# Patient Record
Sex: Male | Born: 1995 | Race: White | Hispanic: No | Marital: Single | Smoking: Never smoker
Health system: Southern US, Community
[De-identification: ages and names within clinical notes are randomized; demographics above are authoritative.]

## PROBLEM LIST (undated history)

## (undated) DIAGNOSIS — F411 Generalized anxiety disorder: Secondary | ICD-10-CM

## (undated) DIAGNOSIS — K589 Irritable bowel syndrome without diarrhea: Secondary | ICD-10-CM

## (undated) HISTORY — DX: Irritable bowel syndrome, unspecified: K58.9

## (undated) HISTORY — DX: Generalized anxiety disorder: F41.1

---
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
## 2017-01-27 ENCOUNTER — Other Ambulatory Visit: Payer: Self-pay | Admitting: Primary Care

## 2017-01-27 DIAGNOSIS — K580 Irritable bowel syndrome with diarrhea: Secondary | ICD-10-CM

## 2017-02-18 ENCOUNTER — Telehealth: Payer: Self-pay | Admitting: *Deleted

## 2017-02-18 DIAGNOSIS — K580 Irritable bowel syndrome with diarrhea: Secondary | ICD-10-CM

## 2017-02-18 NOTE — Telephone Encounter (Signed)
Patient's dad called requesting a refill on Viberzi. Patient's dad stated that patient ran out of the prescription and his mom is a NP and was able to get samples of the Veberzi 100 mg and he has been taking that and it seems to be working better for him. Patient's dad requested a script for Viberzi 100 mg and needs it by tomorrow if possible because most pharmacist don't keep it in stock and will have to order it and it can take them a day to get it in.  Mrs. Cristal DeerChristopher requested a call back.

## 2017-02-19 MED ORDER — ELUXADOLINE 100 MG PO TABS: 100.0000 mg | ORAL_TABLET | Freq: Two times a day (BID) | ORAL | 0 refills | Status: DC

## 2017-02-19 NOTE — Telephone Encounter (Signed)
Please call in.  Thanks.   

## 2017-02-19 NOTE — Telephone Encounter (Signed)
Medication phoned to pharmacy.  

## 2017-03-15 ENCOUNTER — Other Ambulatory Visit: Payer: Self-pay | Admitting: Primary Care

## 2017-03-15 ENCOUNTER — Other Ambulatory Visit: Payer: Self-pay | Admitting: Family Medicine

## 2017-03-15 DIAGNOSIS — F411 Generalized anxiety disorder: Secondary | ICD-10-CM

## 2017-03-16 NOTE — Telephone Encounter (Signed)
Ok to refill? Electronically refill request for Eluxadoline (VIBERZI) 100 MG TABS.  Last prescribed on 02/19/17. Last seen on 11/04/2016

## 2017-03-17 NOTE — Telephone Encounter (Signed)
Called in medication to the pharmacy as instructed. 

## 2017-03-19 NOTE — Telephone Encounter (Signed)
Note left that pt had Viberzi for 24 more hrs,. [facility] [address] and he said the Denn[place] NancyViberzi is covered but when med was requested med was out of stock and it was too early to fill; last filled 02/20/17. Kathlene NovemberMike said med can be picked up on 03/20/17 around noon. I called contact # and spoke with pts mother (DPR signed) and gave this information. pts mom voiced understanding.

## 2017-04-22 ENCOUNTER — Other Ambulatory Visit: Payer: Self-pay

## 2017-04-22 ENCOUNTER — Other Ambulatory Visit: Payer: Self-pay | Admitting: Internal Medicine

## 2017-04-22 DIAGNOSIS — K580 Irritable bowel syndrome with diarrhea: Secondary | ICD-10-CM

## 2017-04-22 NOTE — Telephone Encounter (Signed)
Message left for patient to return my call.  

## 2017-04-22 NOTE — Telephone Encounter (Signed)
pts mom (DPR signed) left v/m requesting rx for Viberzi; request to pick up ASAP because pharmacy has to order med when receives rx. Last printed # 60 on 03/16/17. Pt last seen 11/04/16. Pts mom also wants to know when viberzi was changed from 75 mg to 100 mg. I advised Mrs Yetta BarreJones per 02/18/17 phone note that pts father had requested to change Viberzi to 100 mg. Mrs Yetta BarreJones voiced understanding.

## 2017-04-22 NOTE — Telephone Encounter (Signed)
Please notify patient and his parents that we require 2-3 days notice for refills, FYI for future refills. Also patient needs lab work to check liver and kidney function while on Viberzi. This has not been done in over 1 year. Please schedule a lab only appointment.  Also, do they need this printed or sent electronically?

## 2017-04-23 MED ORDER — ELUXADOLINE 100 MG PO TABS: 1.0000 | ORAL_TABLET | Freq: Two times a day (BID) | ORAL | 0 refills | Status: DC

## 2017-04-23 NOTE — Telephone Encounter (Signed)
Please call patient's father,Carvin,about rx.  He can be reached at [phone].

## 2017-04-23 NOTE — Telephone Encounter (Signed)
When grandfather came to pick up. I have notified grandfather the information below as will as I have already notified father.

## 2017-04-23 NOTE — Telephone Encounter (Signed)
Spoken and notified patient's father of William Singleton's comments. Patient's father stated that he will have patient call our office to schedule the lab appt.   He asked if we can call the Rx to the pharmacy. Patient has been our for a couple days  He also mention that they had the pharmacy order the medication just needed th authorization,

## 2017-04-23 NOTE — Telephone Encounter (Signed)
Mom called - she states that the rx can not be called in, that it has to be picked up, and that it must be done today. She also said that Mercy MooreGrandpa is coming to get the rx today, mom is out of town on work. She has limited access to her phone. Her phone number is [phone].

## 2017-04-23 NOTE — Telephone Encounter (Signed)
Noted. Rx printed for one month supply. Will provide additional refills once he's completed the required labs.

## 2017-04-29 ENCOUNTER — Other Ambulatory Visit (INDEPENDENT_AMBULATORY_CARE_PROVIDER_SITE_OTHER): Payer: 59

## 2017-04-29 DIAGNOSIS — K580 Irritable bowel syndrome with diarrhea: Secondary | ICD-10-CM

## 2017-04-30 LAB — COMPREHENSIVE METABOLIC PANEL
ALBUMIN: 4.3 g/dL (ref 3.5–5.2)
ALK PHOS: 76 U/L (ref 52–171)
ALT: 16 U/L (ref 0–53)
AST: 14 U/L (ref 0–37)
BILIRUBIN TOTAL: 0.4 mg/dL (ref 0.2–1.2)
BUN: 18 mg/dL (ref 6–23)
CALCIUM: 9.3 mg/dL (ref 8.4–10.5)
CO2: 28 mEq/L (ref 19–32)
CREATININE: 0.97 mg/dL (ref 0.40–1.50)
Chloride: 103 mEq/L (ref 96–112)
GFR: 105.72 mL/min (ref >60.00)
Glucose, Bld: 95 mg/dL (ref 70–99)
Potassium: 3.9 mEq/L (ref 3.5–5.1)
Sodium: 138 mEq/L (ref 135–145)
TOTAL PROTEIN: 6.8 g/dL (ref 6.0–8.3)

## 2017-05-01 ENCOUNTER — Encounter: Payer: Self-pay | Admitting: *Deleted

## 2017-05-01 NOTE — Telephone Encounter (Signed)
Patient's mother notified of results.  She would like rx called in to [facility] on [facility] [address].

## 2017-05-10 ENCOUNTER — Other Ambulatory Visit: Payer: Self-pay | Admitting: Primary Care

## 2017-05-10 DIAGNOSIS — K580 Irritable bowel syndrome with diarrhea: Secondary | ICD-10-CM

## 2017-05-11 NOTE — Telephone Encounter (Signed)
Ok to refill? Electronically refill request for Eluxadoline (VIBERZI) 100 MG TABS  Last prescribed on 04/23/2017.   According to patient's mother, this medication need to order a head of time and was told by pharmacy that it take over 1 week.  Patient's mother ask if we can call in the medication to [facility].

## 2017-05-11 NOTE — Telephone Encounter (Signed)
Called in medication to the pharmacy as instructed. 

## 2017-08-31 ENCOUNTER — Other Ambulatory Visit: Payer: Self-pay

## 2017-08-31 DIAGNOSIS — K580 Irritable bowel syndrome with diarrhea: Secondary | ICD-10-CM

## 2017-08-31 MED ORDER — ELUXADOLINE 100 MG PO TABS: 1.0000 | ORAL_TABLET | Freq: Two times a day (BID) | ORAL | 1 refills | Status: DC

## 2017-08-31 NOTE — Telephone Encounter (Signed)
Called in medication to the pharmacy as instructed. 

## 2017-08-31 NOTE — Telephone Encounter (Signed)
Please phone in Viberzi

## 2017-08-31 NOTE — Telephone Encounter (Signed)
PLEASE NOTE: All timestamps contained within this report are represented as [place] Standard Time. CONFIDENTIALTY NOTICE: This fax transmission is intended only for the addressee. It contains information that is legally privileged, confidential or otherwise protected from use or disclosure. If you are not the intended recipient, you are strictly prohibited from reviewing, disclosing, copying using or disseminating any of this information or taking any action in reliance on or regarding this information. If you have received this fax in error, please notify us immediately by telephone so that we can arrange for its return to us. Phone: [phone], Toll-Free: [phone], Fax: [phone] Page: 1 of 1 Call Id: [id number] [facility] Night - Client TELEPHONE ADVICE RECORD [facility] Patient Name: William Singleton Gender: Male DOB: 02/08/1996 Age: [age] Y 6 M 18 D Return Phone Number: [phone] (Primary) Address: City/State/Zip: [place] [employer] [place] [id number] Client [facility] Night - Client Client Site [facility] - Night Physician Vernona Riegerlark, Katherine - NP Contact Type Call Who Is Calling Patient / Member / Family / Caregiver Call Type Triage / Clinical Caller Name William Singleton Relationship To Patient Father Return Phone Number [phone] (Primary) Chief Complaint Prescription Refill or Medication Request (non symptomatic) Reason for Call Symptomatic / Request for Health Information Initial Comment Callers son is out of meds and needs pre authorization pharmacy has also faxed office and pt needs his meds. Translation No Nurse Assessment Guidelines Guideline Title Affirmed Question Affirmed Notes Nurse Date/Time (Eastern Time) Disp. Time Lamount Cohen(Eastern Time) Disposition Final User 08/28/2017 8:46:01 PM Clinical Call Yes Manson PasseyBrown, RN, Jetty Duhameleborah Caller Disagree/Comply Comply Caller Understands  Yes PreDisposition Home Care Comments User: Haydee Monicaeborah, Brown, RN Date/Time Lamount Cohen(Eastern Time): 08/28/2017 8:45:44 PM instructed pt dad that prior authorization would not occur over the weekend. Instructed him to call pharmacy and see what it would cost out of pocket to get 4 dose for the weekend, explained process of prior authorization, and that MD may have done their part, but that the insurance company may not have completed theirs. also instructed him to call the saturday clinic tomorrow am to see if they can do anything. dad verbalized understanding

## 2017-08-31 NOTE — Telephone Encounter (Signed)
Prior Berkley Harveyuth has been send to patient's insurance for VIBERZI 100 mg. Waiting on response.   VIBERZI 100 MG TABS Last prescribed on 05/11/2017. Last seen on 11/04/2016.

## 2017-08-31 NOTE — Telephone Encounter (Signed)
Patient's father was notified of the approval of the prior auth

## 2017-08-31 NOTE — Telephone Encounter (Signed)
Received responded from patient's insurance.  This medication is approved for coverage until 08/31/2018.

## 2017-11-27 ENCOUNTER — Other Ambulatory Visit: Payer: Self-pay | Admitting: Primary Care

## 2017-11-27 DIAGNOSIS — F411 Generalized anxiety disorder: Secondary | ICD-10-CM

## 2017-11-30 NOTE — Telephone Encounter (Signed)
Letter send to patient remind patient to schedule follow up for any more refills.

## 2017-12-30 ENCOUNTER — Ambulatory Visit: Payer: 59 | Admitting: Primary Care

## 2017-12-30 DIAGNOSIS — Z0289 Encounter for other administrative examinations: Secondary | ICD-10-CM

## 2018-01-08 ENCOUNTER — Encounter: Payer: Self-pay | Admitting: Primary Care

## 2018-01-08 ENCOUNTER — Ambulatory Visit (INDEPENDENT_AMBULATORY_CARE_PROVIDER_SITE_OTHER): Payer: 59 | Admitting: Primary Care

## 2018-01-08 VITALS — BP 120/82 | HR 71 | Temp 98.7°F | Ht 69.75 in | Wt 215.5 lb

## 2018-01-08 DIAGNOSIS — F411 Generalized anxiety disorder: Secondary | ICD-10-CM

## 2018-01-08 DIAGNOSIS — K580 Irritable bowel syndrome with diarrhea: Secondary | ICD-10-CM

## 2018-01-08 LAB — COMPREHENSIVE METABOLIC PANEL
ALBUMIN: 4.4 g/dL (ref 3.5–5.2)
ALK PHOS: 90 U/L (ref 52–171)
ALT: 23 U/L (ref 0–53)
AST: 16 U/L (ref 0–37)
BUN: 11 mg/dL (ref 6–23)
CO2: 30 meq/L (ref 19–32)
CREATININE: 0.92 mg/dL (ref 0.40–1.50)
Calcium: 9.4 mg/dL (ref 8.4–10.5)
Chloride: 103 mEq/L (ref 96–112)
GFR: 111.57 mL/min (ref >60.00)
Glucose, Bld: 86 mg/dL (ref 70–99)
Potassium: 4.2 mEq/L (ref 3.5–5.1)
Sodium: 140 mEq/L (ref 135–145)
Total Bilirubin: 0.6 mg/dL (ref 0.2–1.2)
Total Protein: 7.2 g/dL (ref 6.0–8.3)

## 2018-01-08 MED ORDER — ESCITALOPRAM OXALATE 20 MG PO TABS: 20.0000 mg | ORAL_TABLET | Freq: Every day | ORAL | 3 refills | Status: DC

## 2018-01-08 NOTE — Progress Notes (Signed)
Subjective:    Patient ID: William Singleton, male    DOB: 06-May-1996, 22 y.o.   MRN: [id number]  HPI  William Singleton is a 22 year old male who presents today for follow up and medication refill.  1) IBS: Currently managed on Viberzi 100 mg BID. He is compliant to his Viberzi and is taking twice daily.He denies diarrhea, constipation, abdominal pain, nausea, vomiting.   2) GAD: Currently managed on Lexapro 20 mg. He denies SI/HI. He's taken the semester off from school, plans to return in the Fall. He is needing refills of his Lexapro today.  Review of Systems  Constitutional: Negative for unexpected weight change.  Respiratory: Negative for shortness of breath.   Gastrointestinal: Negative for abdominal pain, blood in stool, constipation, diarrhea, rectal pain and vomiting.       Past Medical History:  Diagnosis Date  . Generalized anxiety disorder   . IBS (irritable bowel syndrome)      Social History   Socioeconomic History  . Marital status: Single    Spouse name: Not on file  . Number of children: Not on file  . Years of education: Not on file  . Highest education level: Not on file  Occupational History  . Not on file  Social Needs  . Financial resource strain: Not on file  . Food insecurity:    Worry: Not on file    Inability: Not on file  . Transportation needs:    Medical: Not on file    Non-medical: Not on file  Tobacco Use  . Smoking status: Never Smoker  . Smokeless tobacco: Never Used  Substance and Sexual Activity  . Alcohol use: No    Alcohol/week: 0.0 oz  . Drug use: No  . Sexual activity: Not on file  Lifestyle  . Physical activity:    Days per week: Not on file    Minutes per session: Not on file  . Stress: Not on file  Relationships  . Social connections:    Talks on phone: Not on file    Gets together: Not on file    Attends religious service: Not on file    Active member of club or organization: Not on file    Attends meetings of  clubs or organizations: Not on file    Relationship status: Not on file  . Intimate partner violence:    Fear of current or ex partner: Not on file    Emotionally abused: Not on file    Physically abused: Not on file    Forced sexual activity: Not on file  Other Topics Concern  . 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   Enjoys playing football, hanging out with friends.   Lives with father.    History reviewed. No pertinent surgical history.  No family history on file.  Allergies  Allergen Reactions  . Penicillins Rash    Current Outpatient Medications on File Prior to Visit  Medication Sig Dispense Refill  . Eluxadoline (VIBERZI) 100 MG TABS Take 1 tablet (100 mg total) by mouth 2 (two) times daily. 180 tablet 1   No current facility-administered medications on file prior to visit.     BP 120/82   Pulse 71   Temp 98.7 F (37.1 C) (Oral)   Ht 5' 9.75" (1.772 m)   Wt 215 lb 8 oz (97.8 kg)   SpO2 98%   BMI 31.14  kg/m    Objective:   Physical Exam  Constitutional: He appears well-nourished.  Neck: Neck supple.  Cardiovascular: Normal rate and regular rhythm.  Pulmonary/Chest: Effort normal and breath sounds normal. He has no wheezes. He has no rales.  Abdominal: Soft. Bowel sounds are normal. There is no tenderness.  Skin: Skin is warm and dry.  Psychiatric: He has a normal mood and affect.          Assessment & Plan:

## 2018-01-08 NOTE — Progress Notes (Signed)
Subjective:    Patient ID: HALO LASKI, male    DOB: [birth year], 22 y.o.   MRN: [id number]  HPI JC VERON is a 22 y.o. male who presents today for routine follow-up. Currently taking Viberzi and Lexapro. 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 Reports hanging with friends during free time. Denies and suicidal ideation or ETOH/Illicit drug/tabacco use. Is currently single and not sexually active.   Review of Systems  Constitutional: Negative for activity change, appetite change, fatigue and unexpected weight change.  Psychiatric/Behavioral: Negative for agitation, decreased concentration, dysphoric mood, self-injury and sleep disturbance. The patient is not nervous/anxious.    PHQ2 = 0    Past Medical History:  Diagnosis Date  . Generalized anxiety disorder   . IBS (irritable bowel syndrome)    History reviewed. No pertinent surgical history. Social History   Socioeconomic History  . Marital status: Single    Spouse name: Not on file  . Number of children: Not on file  . Years of education: Not on file  . Highest education level: Not on file  Occupational History  . Not on file  Social Needs  . Financial resource strain: Not on file  . Food insecurity:    Worry: Not on file    Inability: Not on file  . Transportation needs:    Medical: Not on file    Non-medical: Not on file  Tobacco Use  . Smoking status: Never Smoker  . Smokeless tobacco: Never Used  Substance and Sexual Activity  . Alcohol use: No    Alcohol/week: 0.0 oz  . Drug use: No  . Sexual activity: Not on file  Lifestyle  . Physical activity:    Days per week: Not on file    Minutes per session: Not on file  . Stress: Not on file  Relationships  . Social connections:    Talks on phone: Not on file    Gets together: Not on file    Attends religious service: Not on file    Active member of  club or organization: Not on file    Attends meetings of clubs or organizations: Not on file    Relationship status: Not on file  . Intimate partner violence:    Fear of current or ex partner: Not on file    Emotionally abused: Not on file    Physically abused: Not on file    Forced sexual activity: Not on file  Other Topics Concern  . 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   Enjoys playing football, hanging out with friends.   Lives with father.   No family history on file. Current Outpatient Medications on File Prior to Visit  Medication Sig Dispense Refill  . Eluxadoline (VIBERZI) 100 MG TABS Take 1 tablet (100 mg total) by mouth 2 (two) times daily. 180 tablet 1  . escitalopram (LEXAPRO) 20 MG tablet Take 1 tablet (20 mg total) by mouth daily. NEED APPOINTMENT FOR ANY MORE REFILLS 90 tablet 0   No current facility-administered medications on file prior to visit.     Objective:   Physical Exam  Constitutional: He appears well-nourished. No distress.  Abdominal: Soft. Bowel sounds are normal. He exhibits no distension and no mass. There is no tenderness.  Psychiatric: He has a normal mood and affect.  His speech is normal and behavior is normal. Thought content normal.    BP 120/82   Pulse 71   Temp 98.7 F (37.1 C) (Oral)   Ht 5' 9.75" (1.772 m)   Wt 215 lb 8 oz (97.8 kg)   SpO2 98%   BMI 31.14 kg/m       Assessment & Plan:   1. GAD (generalized anxiety disorder) - Doing well on Lexapro - Comprehensive metabolic panel - escitalopram (LEXAPRO) 20 MG tablet; Take 1 tablet (20 mg total) by mouth daily.  Dispense: 90 tablet; Refill: 3  2. Irritable bowel syndrome with diarrhea - Well managed on Viberzi - Comprehensive metabolic panel   Rebecka ApleyJoshua M Forrester Blando, RN, Adult-Geriatric Nurse Practitioner Student

## 2018-01-08 NOTE — Patient Instructions (Signed)
Glad everything is going well. We have sent your prescription to refill your lexapro.   Best of luck at school and when you need a refill on your Viberzi, just have your pharmacy send us a request.  Stop by the lab on your way out.   It has been a pleasure seeing you today. Rebecka ApleyJoshua M Melenie Minniear, RN, Adult-Geriatric Nurse Practitioner Student and Mayra ReelKate Clark, AGNP

## 2018-01-08 NOTE — Assessment & Plan Note (Signed)
Doing well on Lexapro 20 mg, continue same. Refills sent to pharmacy.

## 2018-01-08 NOTE — Assessment & Plan Note (Signed)
Stable on Viberzi, continue same. Discussed to have his pharmacy contact us when needing refills. CMP pending today.

## 2018-03-29 ENCOUNTER — Other Ambulatory Visit: Payer: Self-pay | Admitting: Internal Medicine

## 2018-03-29 DIAGNOSIS — K580 Irritable bowel syndrome with diarrhea: Secondary | ICD-10-CM

## 2018-03-30 NOTE — Telephone Encounter (Unsigned)
Copied from CRM [phone]#[id number]. Topic: Quick Communication - Rx Refill/Question >> Mar 30, 2018 12:07 PM Floria RavelingStovall, Shana A wrote: Medication:  VIBERZI 100 MG TABS [Pharmacy Med Name: VIBERZI 100MG   Has the patient contacted their pharmacy? No  (Agent: If no, request that the patient contact the pharmacy for the refill.) (Agent: If yes, when and what did the pharmacy advise?)  Preferred Pharmacy (with phone number or street name): [facility] [address]   Agent: Please be advised that RX refills may take up to 3 business days. We ask that you follow-up with your pharmacy.

## 2018-03-30 NOTE — Telephone Encounter (Signed)
Viberzi refill Last OV:01/08/18 Last refill:08/31/17 180 tab/1 refill ZOX:WRUEAPCP:[phone] [place] - [address] [phone] (Phone) [phone] (Fax)

## 2018-03-31 ENCOUNTER — Encounter: Payer: Self-pay | Admitting: Primary Care

## 2018-03-31 ENCOUNTER — Other Ambulatory Visit: Payer: Self-pay | Admitting: Internal Medicine

## 2018-03-31 DIAGNOSIS — K580 Irritable bowel syndrome with diarrhea: Secondary | ICD-10-CM

## 2018-03-31 NOTE — Telephone Encounter (Signed)
Noted, refill sent to pharmacy. 

## 2018-04-08 ENCOUNTER — Encounter: Payer: Self-pay | Admitting: Primary Care

## 2018-04-09 ENCOUNTER — Encounter: Payer: Self-pay | Admitting: Primary Care

## 2018-05-27 ENCOUNTER — Other Ambulatory Visit: Payer: Self-pay | Admitting: Primary Care

## 2018-05-27 DIAGNOSIS — K580 Irritable bowel syndrome with diarrhea: Secondary | ICD-10-CM

## 2018-05-27 DIAGNOSIS — F411 Generalized anxiety disorder: Secondary | ICD-10-CM

## 2018-05-27 MED ORDER — ESCITALOPRAM OXALATE 20 MG PO TABS
20.0000 mg | ORAL_TABLET | Freq: Every day | ORAL | 3 refills | Status: DC
Start: 2018-05-27 — End: 2018-07-26

## 2018-05-27 NOTE — Telephone Encounter (Signed)
Patient has enough medication on file at his pharmacy until January 2020. Will you please find out why they are requesting a refill this soon?

## 2018-05-27 NOTE — Telephone Encounter (Signed)
Refill of Viberzi  LOV 01/08/18 K. Clark  [facility] 03/[facility]  #180  1 refill   [id number] - [place] - [address] AT [facility] [address] [place] [address]         [phone] (Phone) [phone] (Fax)

## 2018-05-27 NOTE — Telephone Encounter (Signed)
Copied from CRM [phone]. Topic: Quick Communication - Rx Refill/Question >> May 27, 2018 12:33 PM William Singleton, [facility] wrote: Medication:  VIBERZI 100 MG TABS escitalopram (LEXAPRO) 20 MG tablet  Has the patient contacted their pharmacy? No - new pharmacy, pt is in school (Agent: If no, request that the patient contact the pharmacy for the refill.) (Agent: If yes, when and what did the pharmacy advise?)  Preferred Pharm[facility] (with phone number or street name): [id number] - [place], [place] - [address] [facility] [address] [place] [address] [phone] (Phone) [phone] (Fax)  Agent: Please be advised that RX refills may take up to 3 business days. We ask that you follow-up with your pharmacy.

## 2018-07-25 ENCOUNTER — Other Ambulatory Visit: Payer: Self-pay | Admitting: Primary Care

## 2018-07-25 DIAGNOSIS — F411 Generalized anxiety disorder: Secondary | ICD-10-CM

## 2018-09-22 ENCOUNTER — Other Ambulatory Visit: Payer: Self-pay | Admitting: Primary Care

## 2018-09-22 DIAGNOSIS — K580 Irritable bowel syndrome with diarrhea: Secondary | ICD-10-CM

## 2018-09-22 NOTE — Telephone Encounter (Signed)
Patient send a MyChart message about this refill. Last prescribed on 03/31/2018. Last seen on 01/08/2018

## 2018-09-23 NOTE — Telephone Encounter (Signed)
Spoke to pt. He understands that another refill not be given until he has an OV.

## 2018-09-23 NOTE — Telephone Encounter (Signed)
Patient never responded to my message back.  I went ahead and sent refills, please remind him that he will need to be seen in April or during his break from college for further refills.

## 2018-09-27 ENCOUNTER — Telehealth: Payer: Self-pay | Admitting: *Deleted

## 2018-09-27 NOTE — Telephone Encounter (Signed)
Spoke to pts father,Amaan, who states viberzi is requiring a PA and wanted to advise Johny DrillingChan to be on the lookout for the pharmacy notification.

## 2018-09-29 NOTE — Telephone Encounter (Signed)
Pt's father wants to know about the blood work the pt has to do once a year in regards to the Viberzi. Pt's father is requesting a call back [phone].

## 2018-09-29 NOTE — Telephone Encounter (Signed)
Spoke to pt's Dad because I was having trouble finding him on CoverMyMeds. He said they took care of it. He has a rebate card and has gotten his medication.

## 2018-09-29 NOTE — Telephone Encounter (Signed)
Please notify the patient and his father that he needs to have liver enzymes and electrolytes checked once a year given potential side effects of the medication.  He will be due in April and will need this for further refills after April.  Him face-to-face in the office at that time.  If he will be in school then he is fine to wait until May when he is home for the summer.

## 2018-09-30 NOTE — Telephone Encounter (Signed)
Left detailed message on VM for Dad. °

## 2018-10-15 ENCOUNTER — Ambulatory Visit (INDEPENDENT_AMBULATORY_CARE_PROVIDER_SITE_OTHER): Payer: 59 | Admitting: Primary Care

## 2018-10-15 ENCOUNTER — Encounter: Payer: Self-pay | Admitting: Primary Care

## 2018-10-15 VITALS — BP 126/82 | HR 72 | Temp 98.3°F | Ht 69.75 in | Wt 211.8 lb

## 2018-10-15 DIAGNOSIS — K580 Irritable bowel syndrome with diarrhea: Secondary | ICD-10-CM | POA: Diagnosis not present

## 2018-10-15 DIAGNOSIS — F411 Generalized anxiety disorder: Secondary | ICD-10-CM | POA: Diagnosis not present

## 2018-10-15 LAB — COMPREHENSIVE METABOLIC PANEL
ALT: 22 U/L (ref 0–53)
AST: 35 U/L (ref 0–37)
Albumin: 4.5 g/dL (ref 3.5–5.2)
Alkaline Phosphatase: 75 U/L (ref 39–117)
BUN: 15 mg/dL (ref 6–23)
CO2: 30 mEq/L (ref 19–32)
Calcium: 9.5 mg/dL (ref 8.4–10.5)
Chloride: 101 mEq/L (ref 96–112)
Creatinine, Ser: 1.05 mg/dL (ref 0.40–1.50)
GFR: 89.44 mL/min (ref >60.00)
Glucose, Bld: 86 mg/dL (ref 70–99)
Potassium: 4.2 mEq/L (ref 3.5–5.1)
Sodium: 138 mEq/L (ref 135–145)
Total Bilirubin: 0.8 mg/dL (ref 0.2–1.2)
Total Protein: 7 g/dL (ref 6.0–8.3)

## 2018-10-15 NOTE — Patient Instructions (Signed)
Stop by the lab prior to leaving today. I will notify you of your results once received.   Continue Viberzi 100 mg twice daily as prescribed.  Continue escitalopram 20 mg (Lexapro) daily as prescribed.  Notify your pharmacy when you need refills.  It was a pleasure to see you today!

## 2018-10-15 NOTE — Assessment & Plan Note (Signed)
Doing well on Lexapro 20 mg, continue same. Denies SI/HI. Will refill when warranted.

## 2018-10-15 NOTE — Assessment & Plan Note (Signed)
Doing well on Viberzi 100 mg BID, continue same. CMP pending. Abdominal exam unremarkable. Will send refills when warranted.

## 2018-10-15 NOTE — Progress Notes (Signed)
Subjective:    Patient ID: William Singleton, male    DOB: [birth year], 22 y.o.   MRN: [id number]  HPI  William Singleton is a 23 year old male who presents today for follow up.  1) IBS: Currently managed on Viberzi 100 mg twice daily for IBS. He's been managed on this for 2-3 years for IBS symptoms and has great success. He denies abdominal pain, diarrhea, bloating.  2) GAD: Currently managed on escitalopram 20 mg daily. Overall feels well managed. He denies SI/HI.   Review of Systems  Respiratory: Negative for shortness of breath.   Cardiovascular: Negative for chest pain.  Gastrointestinal: Negative for abdominal distention, abdominal pain, diarrhea and nausea.  Psychiatric/Behavioral: Negative for suicidal ideas.       Feels well managed on Lexapro 20 mg        Past Medical History:  Diagnosis Date  . Generalized anxiety disorder   . IBS (irritable bowel syndrome)      Social History   Socioeconomic History  . Marital status: Single    Spouse name: Not on file  . Number of children: Not on file  . Years of education: Not on file  . Highest education level: Not on file  Occupational History  . Not on file  Social Needs  . Financial resource strain: Not on file  . Food insecurity:    Worry: Not on file    Inability: Not on file  . Transportation needs:    Medical: Not on file    Non-medical: Not on file  Tobacco Use  . Smoking status: Never Smoker  . Smokeless tobacco: Never Used  Substance and Sexual Activity  . Alcohol use: No    Alcohol/week: 0.0 standard drinks  . Drug use: No  . Sexual activity: Not on file  Lifestyle  . Physical activity:    Days per week: Not on file    Minutes per session: Not on file  . Stress: Not on file  Relationships  . Social connections:    Talks on phone: Not on file    Gets together: Not on file    Attends religious service: Not on file    Active member of club or organization: Not on file    Attends meetings of  clubs or organizations: Not on file    Relationship status: Not on file  . Intimate partner violence:    Fear of current or ex partner: Not on file    Emotionally abused: Not on file    Physically abused: Not on file    Forced sexual activity: Not on file  Other Topics Concern  . 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   Enjoys playing football, hanging out with friends.   Lives with father.    No past surgical history on file.  No family history on file.  Allergies  Allergen Reactions  . Penicillins Rash    Current Outpatient Medications on File Prior to Visit  Medication Sig Dispense Refill  . escitalopram (LEXAPRO) 20 MG tablet TAKE 1 TABLET BY MOUTH EVERY DAY 90 tablet 1  . VIBERZI 100 MG TABS TAKE 1 TABLET BY MOUTH TWICE DAILY 180 tablet 0   No current facility-administered medications on file prior to visit.     BP 126/82   Pulse 72   Temp 98.3 F (36.8 C) (Oral)   Ht 5' 9.75" (1.772 m)   Wt  211 lb 12 oz (96 kg)   SpO2 98%   BMI 30.60 kg/m    Objective:   Physical Exam  Constitutional: He appears well-nourished.  Neck: Neck supple.  Cardiovascular: Normal rate and regular rhythm.  Respiratory: Effort normal and breath sounds normal.  GI: Soft. There is no abdominal tenderness.  Skin: Skin is warm and dry.  Psychiatric: He has a normal mood and affect.           Assessment & Plan:

## 2019-01-04 ENCOUNTER — Other Ambulatory Visit: Payer: Self-pay | Admitting: Primary Care

## 2019-01-04 DIAGNOSIS — K580 Irritable bowel syndrome with diarrhea: Secondary | ICD-10-CM

## 2019-01-05 NOTE — Telephone Encounter (Signed)
Last prescribed on 09/23/2018. Last office visit on 10/15/2018 . No future appointment

## 2019-01-05 NOTE — Telephone Encounter (Signed)
Noted, refill sent to pharmacy. 

## 2019-02-08 ENCOUNTER — Other Ambulatory Visit: Payer: Self-pay | Admitting: Primary Care

## 2019-02-08 DIAGNOSIS — F411 Generalized anxiety disorder: Secondary | ICD-10-CM

## 2019-02-08 NOTE — Telephone Encounter (Signed)
Last prescribed on 07/26/2018. Last office visit on 10/15/2018. No future appointment

## 2019-02-08 NOTE — Telephone Encounter (Signed)
Noted, refills sent to pharmacy. 

## 2019-07-08 ENCOUNTER — Other Ambulatory Visit: Payer: Self-pay | Admitting: Primary Care

## 2019-07-08 DIAGNOSIS — K580 Irritable bowel syndrome with diarrhea: Secondary | ICD-10-CM

## 2019-07-08 NOTE — Telephone Encounter (Signed)
Could you refill this for William Singleton.  Last prescribed on 01/05/2019 . Last appointment on 10/15/2018. No future appointment

## 2019-07-08 NOTE — Telephone Encounter (Signed)
Patient called and said he ran out of his medication yesterday. Patient said the name of the medication is Viberzi  Patient uses [facility]

## 2019-11-28 ENCOUNTER — Telehealth: Payer: Self-pay | Admitting: Internal Medicine

## 2019-11-28 DIAGNOSIS — K580 Irritable bowel syndrome with diarrhea: Secondary | ICD-10-CM

## 2019-11-30 NOTE — Telephone Encounter (Signed)
Please notify patient that we need to see him in the office for follow-up and repeat labs in order to continue to refill his IBS medication.  Please notify me when the appointment has been scheduled, I can send a 30-day supply until he is seen.

## 2019-11-30 NOTE — Telephone Encounter (Signed)
Last prescribed on 07/08/2019. Last appointment on 10/15/2018. No future appointment

## 2019-12-01 NOTE — Telephone Encounter (Signed)
Noted, refill sent to pharmacy. 

## 2019-12-01 NOTE — Telephone Encounter (Signed)
Spoken and notified patient of William Singleton comments. Follow up has been schedule on 12/26/2019.

## 2019-12-06 NOTE — Telephone Encounter (Signed)
Patient is calling in regards to refill.  He stated that he called yesterday morning and a message was going to be sent over but I do not see anything Patient stated that his insurance is requesting an approval from the doctor before they will cover the medication  Please advise

## 2019-12-08 NOTE — Telephone Encounter (Signed)
Message left for patient to return my call.  Need update insurance information for patient to do the prior auth.

## 2019-12-13 NOTE — Telephone Encounter (Signed)
Message left for patient to return my call.  

## 2019-12-15 ENCOUNTER — Telehealth: Payer: Self-pay

## 2019-12-15 NOTE — Telephone Encounter (Signed)
[employer] is having trouble doing electronic PA. Asked that you call them at [phone] to do PA on Viberzi.

## 2019-12-19 NOTE — Telephone Encounter (Signed)
Noted. However need updated insurance card. Left 3 messages for patient to return my call for the updated information.

## 2019-12-26 ENCOUNTER — Ambulatory Visit: Payer: 59 | Admitting: Primary Care

## 2019-12-31 ENCOUNTER — Other Ambulatory Visit: Payer: Self-pay | Admitting: Primary Care

## 2019-12-31 DIAGNOSIS — F411 Generalized anxiety disorder: Secondary | ICD-10-CM

## 2020-01-04 ENCOUNTER — Ambulatory Visit (INDEPENDENT_AMBULATORY_CARE_PROVIDER_SITE_OTHER): Payer: 59 | Admitting: Primary Care

## 2020-01-04 ENCOUNTER — Encounter: Payer: Self-pay | Admitting: Primary Care

## 2020-01-04 ENCOUNTER — Other Ambulatory Visit: Payer: Self-pay

## 2020-01-04 DIAGNOSIS — K580 Irritable bowel syndrome with diarrhea: Secondary | ICD-10-CM

## 2020-01-04 DIAGNOSIS — F411 Generalized anxiety disorder: Secondary | ICD-10-CM | POA: Diagnosis not present

## 2020-01-04 MED ORDER — VIBERZI 100 MG PO TABS: 1.0000 | ORAL_TABLET | Freq: Two times a day (BID) | ORAL | 3 refills | Status: DC

## 2020-01-04 MED ORDER — ESCITALOPRAM OXALATE 20 MG PO TABS: ORAL_TABLET | ORAL | 3 refills | Status: DC

## 2020-01-04 NOTE — Patient Instructions (Signed)
Stop by the lab prior to leaving today. I will notify you of your results once received.   It was a pleasure to see you today!  

## 2020-01-04 NOTE — Assessment & Plan Note (Signed)
Doing well on Viberzi, CMP pending. Refills sent to pharmacy.

## 2020-01-04 NOTE — Progress Notes (Signed)
Subjective:    Patient ID: William Singleton, male    DOB: May 21, 1996, 24 y.o.   MRN: [id number]  HPI  This visit occurred during the SARS-CoV-2 public health emergency.  Safety protocols were in place, including screening questions prior to the visit, additional usage of staff PPE, and extensive cleaning of exam room while observing appropriate contact time as indicated for disinfecting solutions.   [name] is a 24 year old male with a history of IBS, GAD who presents today for follow up and annual lab testing.  Currently managed on escitalopram 20 mg daily for anxiety. Overall doing well on current regimen. Denies SI/HI.   Currently managed on Viberzi 100 mg twice daily for IBS. Sometimes will take 100 mg once daily. He denies constipation, bloody stools.   BP Readings from Last 3 Encounters:  01/04/20 128/82  10/15/18 126/82  01/08/18 120/82     Review of Systems  Respiratory: Negative for shortness of breath.   Cardiovascular: Negative for chest pain.  Gastrointestinal: Negative for blood in stool and constipation.  Psychiatric/Behavioral: The patient is not nervous/anxious.        Past Medical History:  Diagnosis Date  . Generalized anxiety disorder   . IBS (irritable bowel syndrome)      Social History   Socioeconomic History  . Marital status: Single    Spouse name: Not on file  . Number of children: Not on file  . Years of education: Not on file  . Highest education level: Not on file  Occupational History  . Not on file  Tobacco Use  . Smoking status: Never Smoker  . Smokeless tobacco: Never Used  Substance and Sexual Activity  . Alcohol use: No    Alcohol/week: 0.0 standard drinks  . Drug use: No  . Sexual activity: Not on file  Other Topics Concern  . Not on file  Social History Narrative   Psychologist, clinical at [employer].   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   Enjoys playing football, hanging out with friends.   Lives with father.    Social Determinants of Health   Financial Resource Strain:   . Difficulty of Paying Living Expenses:   Food Insecurity:   . Worried About Charity fundraiser in the Last Year:   . Arboriculturist in the Last Year:   Transportation Needs:   . Film/video editor (Medical):   Marland Kitchen Lack of Transportation (Non-Medical):   Physical Activity:   . Days of Exercise per Week:   . Minutes of Exercise per Session:   Stress:   . Feeling of Stress :   Social Connections:   . Frequency of Communication with Friends and Family:   . Frequency of Social Gatherings with Friends and Family:   . Attends Religious Services:   . Active Member of Clubs or Organizations:   . Attends Archivist Meetings:   Marland Kitchen Marital Status:   Intimate Partner Violence:   . Fear of Current or Ex-Partner:   . Emotionally Abused:   Marland Kitchen Physically Abused:   . Sexually Abused:     No past surgical history on file.  No family history on file.  Allergies  Allergen Reactions  . Penicillins Rash    Current Outpatient Medications on File Prior to Visit  Medication Sig Dispense Refill  . escitalopram (LEXAPRO) 20 MG tablet TAKE 1 TABLET(20 MG) BY MOUTH DAILY FOR ANXIETY 30 tablet 0  . VIBERZI 100 MG TABS TAKE 1  TABLET BY MOUTH TWICE DAILY 60 tablet 0   No current facility-administered medications on file prior to visit.    BP 128/82   Pulse 69   Temp (!) 97 F (36.1 C) (Temporal)   Ht 5' 9.75" (1.772 m)   Wt 207 lb 12 oz (94.2 kg)   SpO2 98%   BMI 30.02 kg/m    Objective:   Physical Exam  Constitutional: He appears well-nourished.  Cardiovascular: Normal rate and regular rhythm.  Respiratory: Effort normal and breath sounds normal.  Musculoskeletal:     Cervical back: Neck supple.  Skin: Skin is warm and dry.  Psychiatric: He has a normal mood and affect.           Assessment & Plan:

## 2020-01-04 NOTE — Assessment & Plan Note (Signed)
Doing well on Lexapro 20 mg. Denies SI/HI. Refills sent to pharmacy.

## 2020-01-05 LAB — COMPREHENSIVE METABOLIC PANEL
ALT: 17 U/L (ref 0–53)
AST: 15 U/L (ref 0–37)
Albumin: 4.3 g/dL (ref 3.5–5.2)
Alkaline Phosphatase: 63 U/L (ref 39–117)
BUN: 14 mg/dL (ref 6–23)
CO2: 32 mEq/L (ref 19–32)
Calcium: 8.9 mg/dL (ref 8.4–10.5)
Chloride: 101 mEq/L (ref 96–112)
Creatinine, Ser: 1.08 mg/dL (ref 0.40–1.50)
GFR: 85.57 mL/min (ref >60.00)
Glucose, Bld: 86 mg/dL (ref 70–99)
Potassium: 4.1 mEq/L (ref 3.5–5.1)
Sodium: 139 mEq/L (ref 135–145)
Total Bilirubin: 0.6 mg/dL (ref 0.2–1.2)
Total Protein: 6.5 g/dL (ref 6.0–8.3)

## 2020-08-10 ENCOUNTER — Telehealth: Payer: Self-pay | Admitting: Primary Care

## 2020-08-10 DIAGNOSIS — K580 Irritable bowel syndrome with diarrhea: Secondary | ICD-10-CM

## 2020-08-17 NOTE — Telephone Encounter (Signed)
Joellen, did you refill his Viberzi 100 mg? If so then it says "print" and not "normal". What's going on? We can refill through April 2022.

## 2020-08-17 NOTE — Telephone Encounter (Signed)
Pharmacy never received prescription and pt is out. Please resend

## 2020-08-17 NOTE — Telephone Encounter (Signed)
Please call dad  [phone]

## 2020-08-20 ENCOUNTER — Other Ambulatory Visit: Payer: Self-pay

## 2020-08-20 DIAGNOSIS — K580 Irritable bowel syndrome with diarrhea: Secondary | ICD-10-CM

## 2020-08-20 MED ORDER — VIBERZI 100 MG PO TABS: ORAL_TABLET | ORAL | 1 refills | Status: DC

## 2020-08-20 NOTE — Telephone Encounter (Signed)
Patient's father came into office checking on status. Please advise and call when sent in electronically. Call back is [phone]

## 2020-08-20 NOTE — Telephone Encounter (Signed)
Script printed but I have faxed to the number for local pharmacy

## 2020-08-20 NOTE — Telephone Encounter (Signed)
Sent in and called and let patient dad know.

## 2020-08-22 ENCOUNTER — Other Ambulatory Visit: Payer: Self-pay

## 2020-08-22 DIAGNOSIS — K580 Irritable bowel syndrome with diarrhea: Secondary | ICD-10-CM

## 2020-08-22 MED ORDER — VIBERZI 100 MG PO TABS: ORAL_TABLET | ORAL | 1 refills | Status: DC

## 2021-03-09 ENCOUNTER — Other Ambulatory Visit: Payer: Self-pay | Admitting: Primary Care

## 2021-03-09 DIAGNOSIS — K580 Irritable bowel syndrome with diarrhea: Secondary | ICD-10-CM

## 2021-03-13 ENCOUNTER — Telehealth: Payer: Self-pay | Admitting: Primary Care

## 2021-03-13 NOTE — Telephone Encounter (Signed)
Called dad (has DPR on file) let know we have called in refill and he needs to be seen for more. He did not want to make appointment at this time states he will have patient call to make. No further action needed at this time.

## 2021-03-13 NOTE — Telephone Encounter (Signed)
Patient Father  Vonna Kotyk came in office requesting to know if  patient medications was refilled . Advise Father that patient will have to call for that info. Give father DPR  to have patient fill out

## 2021-03-20 ENCOUNTER — Telehealth: Payer: Self-pay | Admitting: Primary Care

## 2021-03-20 ENCOUNTER — Telehealth: Payer: Self-pay

## 2021-03-20 ENCOUNTER — Ambulatory Visit: Payer: 59 | Admitting: Primary Care

## 2021-03-20 NOTE — Telephone Encounter (Signed)
[facility] Night - Client Nonclinical Telephone Record AccessNurse Client [facility] Night - Client Client Site [facility] [place] - Night Contact Type Call Who Is Calling Patient / Member / Family / Caregiver Caller Name Kaisei Gilbo Phone Number [phone] Patient Name William Singleton Patient DOB [birth year] Call Type Message Only Information Provided Reason for Call Request to Reschedule Office Appointment Initial Comment caller states his son has an appt today at 1130 and he's unable to get off work / caller asking can it be reschedule for next Wednesday Patient request to speak to RN No Disp. Time Disposition Final User 03/20/2021 7:56:10 AM General Information Provided Yes Tiburcio Pea, Lanette Call Closed By: Evette Doffing Transaction Date/Time: 03/20/2021 7:53:01 AM (ET)

## 2021-03-20 NOTE — Telephone Encounter (Signed)
Called pt to reschedule appt. Pt did not answer so I left voicemail to return call. Held appt slot for the same time as previous time

## 2021-03-27 ENCOUNTER — Ambulatory Visit (INDEPENDENT_AMBULATORY_CARE_PROVIDER_SITE_OTHER): Payer: 59 | Admitting: Primary Care

## 2021-03-27 ENCOUNTER — Other Ambulatory Visit: Payer: Self-pay

## 2021-03-27 ENCOUNTER — Encounter: Payer: Self-pay | Admitting: Primary Care

## 2021-03-27 VITALS — BP 118/72 | HR 78 | Temp 98.0°F | Ht 69.75 in | Wt 203.0 lb

## 2021-03-27 DIAGNOSIS — F411 Generalized anxiety disorder: Secondary | ICD-10-CM

## 2021-03-27 DIAGNOSIS — K580 Irritable bowel syndrome with diarrhea: Secondary | ICD-10-CM | POA: Diagnosis not present

## 2021-03-27 LAB — COMPREHENSIVE METABOLIC PANEL
ALT: 17 U/L (ref 0–53)
AST: 14 U/L (ref 0–37)
Albumin: 4.3 g/dL (ref 3.5–5.2)
Alkaline Phosphatase: 72 U/L (ref 39–117)
BUN: 11 mg/dL (ref 6–23)
CO2: 31 mEq/L (ref 19–32)
Calcium: 9.4 mg/dL (ref 8.4–10.5)
Chloride: 101 mEq/L (ref 96–112)
Creatinine, Ser: 0.93 mg/dL (ref 0.40–1.50)
GFR: 116.05 mL/min (ref >60.00)
Glucose, Bld: 74 mg/dL (ref 70–99)
Potassium: 4.3 mEq/L (ref 3.5–5.1)
Sodium: 137 mEq/L (ref 135–145)
Total Bilirubin: 0.4 mg/dL (ref 0.2–1.2)
Total Protein: 7 g/dL (ref 6.0–8.3)

## 2021-03-27 LAB — CBC
HCT: 42.7 % (ref 39.0–52.0)
Hemoglobin: 14.9 g/dL (ref 13.0–17.0)
MCHC: 34.8 g/dL (ref 30.0–36.0)
MCV: 87.9 fl (ref 78.0–100.0)
Platelets: 343 10*3/uL (ref 150.0–400.0)
RBC: 4.86 Mil/uL (ref 4.22–5.81)
RDW: 13 % (ref 11.5–15.5)
WBC: 8 10*3/uL (ref 4.0–10.5)

## 2021-03-27 MED ORDER — VIBERZI 75 MG PO TABS: 75.0000 mg | ORAL_TABLET | Freq: Two times a day (BID) | ORAL | 0 refills | Status: DC

## 2021-03-27 NOTE — Patient Instructions (Signed)
We reduced the dose of your Viberzi to 75 mg. Take this once daily, okay to take another dose if needed.  Stop by the lab prior to leaving today. I will notify you of your results once received.   Update me in 3-4 months.  It was a pleasure to see you today!

## 2021-03-27 NOTE — Assessment & Plan Note (Signed)
Discussed the option for weaning off Viberzi, he is willing to wean down to 75 mg once daily and take second dose if needed. Agreed.   He will update in 3-4 months, may consider discontinuation to PRN use.   CMP and CBC pending.

## 2021-03-27 NOTE — Assessment & Plan Note (Signed)
Stopped Lexapro 20 mg by himself a few months ago, is doing well off treatment. Continue fof treatment.

## 2021-03-27 NOTE — Progress Notes (Signed)
Subjective:    Patient ID: William Singleton, male    DOB: 1996-07-10, 25 y.o.   MRN: [id number]  HPI  William Singleton is a very pleasant 25 y.o. male with a history of IBS and GAD who presents today for medication refill and follow up.  Long history of IBS, has been managed on Viberzi 100 mg BID since 2017. He's been without his Viberzi for about two weeks as he ran out. Overall he's done well without his medication, but is on vacation from work.    He's mostly been taking one tablet once daily, will take a second tablet if he eats something that may disrupt his bowels. Doing well on once daily tablet.   Managed on Lexapro 20 mg for GAD, has been taking for years but stopped taking 2-3 months ago, has done well overall off of treatment. He doesn't feel anxious or nervous. His anxiety mostly stemmed from his IBS symptoms.      Review of Systems  Respiratory:  Negative for shortness of breath.   Cardiovascular:  Negative for chest pain.  Gastrointestinal:  Negative for abdominal pain and diarrhea.  Psychiatric/Behavioral:  The patient is not nervous/anxious.         Past Medical History:  Diagnosis Date   Generalized anxiety disorder    IBS (irritable bowel syndrome)     Social History   Socioeconomic History   Marital status: Single    Spouse name: Not on file   Number of children: Not on file   Years of education: Not on file   Highest education level: Not on file  Occupational History   Not on file  Tobacco Use   Smoking status: Never   Smokeless tobacco: Never  Substance and Sexual Activity   Alcohol use: No    Alcohol/week: 0.0 standard drinks   Drug use: No   Sexual activity: Not on file  Other Topics Concern   Not on file  Social History Narrative   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   Enjoys playing football, hanging out with friends.   Lives with father.   Social Determinants of Health   Financial Resource  Strain: Not on file  Food Insecurity: Not on file  Transportation Needs: Not on file  Physical Activity: Not on file  Stress: Not on file  Social Connections: Not on file  Intimate Partner Violence: Not on file    History reviewed. No pertinent surgical history.  History reviewed. No pertinent family history.  Allergies  Allergen Reactions   Penicillins Rash    Current Outpatient Medications on File Prior to Visit  Medication Sig Dispense Refill   Eluxadoline (VIBERZI) 100 MG TABS TAKE 1 TABLET BY MOUTH TWICE DAILY. Must be seen for further refills. 60 tablet 0   escitalopram (LEXAPRO) 20 MG tablet TAKE 1 TABLET(20 MG) BY MOUTH DAILY FOR ANXIETY 90 tablet 3   No current facility-administered medications on file prior to visit.    BP 118/72   Pulse 78   Temp 98 F (36.7 C) (Temporal)   Ht 5' 9.75" (1.772 m)   Wt 203 lb (92.1 kg)   SpO2 99%   BMI 29.34 kg/m  Objective:   Physical Exam Cardiovascular:     Rate and Rhythm: Normal rate and regular rhythm.  Pulmonary:     Effort: Pulmonary effort is normal.     Breath sounds: Normal breath sounds. No wheezing or rales.  Abdominal:  General: Abdomen is flat.     Palpations: Abdomen is soft.     Tenderness: There is no abdominal tenderness.  Musculoskeletal:     Cervical back: Neck supple.  Skin:    General: Skin is warm and dry.  Neurological:     Mental Status: He is alert and oriented to person, place, and time.          Assessment & Plan:      This visit occurred during the SARS-CoV-2 public health emergency.  Safety protocols were in place, including screening questions prior to the visit, additional usage of staff PPE, and extensive cleaning of exam room while observing appropriate contact time as indicated for disinfecting solutions.

## 2021-09-24 ENCOUNTER — Telehealth (INDEPENDENT_AMBULATORY_CARE_PROVIDER_SITE_OTHER): Payer: 59 | Admitting: Primary Care

## 2021-09-24 ENCOUNTER — Encounter: Payer: Self-pay | Admitting: Primary Care

## 2021-09-24 VITALS — Ht 69.75 in | Wt 203.0 lb

## 2021-09-24 DIAGNOSIS — F411 Generalized anxiety disorder: Secondary | ICD-10-CM

## 2021-09-24 MED ORDER — HYDROXYZINE HCL 10 MG PO TABS: 10.0000 mg | ORAL_TABLET | Freq: Two times a day (BID) | ORAL | 0 refills | Status: DC | PRN

## 2021-09-24 MED ORDER — ESCITALOPRAM OXALATE 20 MG PO TABS: 20.0000 mg | ORAL_TABLET | Freq: Every day | ORAL | 1 refills | Status: DC

## 2021-09-24 NOTE — Progress Notes (Signed)
Patient ID: William Singleton, male    DOB: 1996-06-24, 26 y.o.   MRN: [id number]  Virtual visit completed through [employer], a video enabled telemedicine application. Due to national recommendations of social distancing due to COVID-19, a virtual visit is felt to be most appropriate for this patient at this time. Reviewed limitations, risks, security and privacy concerns of performing a virtual visit and the availability of in person appointments. I also reviewed that there may be a patient responsible charge related to this service. The patient agreed to proceed.   Patient location: home Provider location: [place] [facility], office Persons participating in this virtual visit: patient, provider   If any vitals were documented, they were collected by patient at home unless specified below.    Ht 5' 9.75" (1.772 m)    Wt 203 lb (92.1 kg)    BMI 29.34 kg/m    CC: Anxiety  Subjective:   HPI: William Singleton is a 26 y.o. male with a history of GAD and IBS presenting on 09/24/2021 to discuss anxiety.  Chronic anxiety history, previously managed on Lexapro 20 mg for which worked well for him historically. During his follow up visit in July 2022 he endorsed discontinuing Lexapro a few months prior and had been doing very well without.   Today he endorses a return in his anxiety over the last several months with symptoms of feeling uneasy, nervous, anxious. He's also having panic attacks. He resumed his Lexapro 20 mg 1 week ago given a return in symptoms and understands that it takes several weeks to reach a therapeutic effect. He would like to continue Lexapro and is asking for something to help quickly with his anxiety now.   He's has no difficulty or side effects since resuming Lexapro at 20 mg.       Relevant past medical, surgical, family and social history reviewed and updated as indicated. Interim medical history since our last visit reviewed. Allergies and medications  reviewed and updated. Outpatient Medications Prior to Visit  Medication Sig Dispense Refill   Eluxadoline (VIBERZI) 75 MG TABS Take 75 mg by mouth 2 (two) times daily. For IBS 180 tablet 0   No facility-administered medications prior to visit.     Per HPI unless specifically indicated in ROS section below Review of Systems  Gastrointestinal:  Negative for nausea.  Psychiatric/Behavioral:  Negative for suicidal ideas. The patient is nervous/anxious.   Objective:  Ht 5' 9.75" (1.772 m)    Wt 203 lb (92.1 kg)    BMI 29.34 kg/m   Wt Readings from Last 3 Encounters:  09/24/21 203 lb (92.1 kg)  03/27/21 203 lb (92.1 kg)  01/04/20 207 lb 12 oz (94.2 kg)       Physical exam: General: Alert and oriented x 3, no distress, does not appear sickly  Pulmonary: Speaks in complete sentences without increased work of breathing, no cough during visit.  Psychiatric: Normal mood, thought content, and behavior.     Results for orders placed or performed in visit on 03/27/21  CBC  Result Value Ref Range   WBC 8.0 4.0 - 10.5 K/uL   RBC 4.86 4.22 - 5.81 Mil/uL   Platelets 343.0 150.0 - 400.0 K/uL   Hemoglobin 14.9 13.0 - 17.0 g/dL   HCT 78.2 95.6 - 21.3 %   MCV 87.9 78.0 - 100.0 fl   MCHC 34.8 30.0 - 36.0 g/dL   RDW 08.6 57.8 - 46.9 %  Comprehensive metabolic panel  Result Value Ref Range   Sodium 137 135 - 145 mEq/L   Potassium 4.3 3.5 - 5.1 mEq/L   Chloride 101 96 - 112 mEq/L   CO2 31 19 - 32 mEq/L   Glucose, Bld 74 70 - 99 mg/dL   BUN 11 6 - 23 mg/dL   Creatinine, Ser 4.65 0.40 - 1.50 mg/dL   Total Bilirubin 0.4 0.2 - 1.2 mg/dL   Alkaline Phosphatase 72 39 - 117 U/L   AST 14 0 - 37 U/L   ALT 17 0 - 53 U/L   Total Protein 7.0 6.0 - 8.3 g/dL   Albumin 4.3 3.5 - 5.2 g/dL   GFR 681.27 >51.70 mL/min   Calcium 9.4 8.4 - 10.5 mg/dL   Assessment & Plan:   Problem List Items Addressed This Visit       Other   Generalized anxiety disorder - Primary    Deteriorated off Lexapro 20  mg, self resumed one week ago.  Fortunately he's has no side effects from starting Lexapro at 20 mg. Continue same.  Discussed that it will take a few weeks to notice improvement and 6 weeks to reach full effect.  Rx for hydroxyzine 10 mg provided to take BID prn. drowsiness precautions provided.   Follow up in July 2023 or sooner if neeed      Relevant Medications   hydrOXYzine (ATARAX) 10 MG tablet   escitalopram (LEXAPRO) 20 MG tablet     Meds ordered this encounter  Medications   hydrOXYzine (ATARAX) 10 MG tablet    Sig: Take 1 tablet (10 mg total) by mouth 2 (two) times daily as needed for anxiety.    Dispense:  60 tablet    Refill:  0    Order Specific Question:   Supervising Provider    Answer:   BEDSOLE, AMY E [[id number]]   escitalopram (LEXAPRO) 20 MG tablet    Sig: Take 1 tablet (20 mg total) by mouth daily. For anxiety    Dispense:  90 tablet    Refill:  1    Order Specific Question:   Supervising Provider    Answer:   BEDSOLE, AMY E [[id number]]   No orders of the defined types were placed in this encounter.   I discussed the assessment and treatment plan with the patient. The patient was provided an opportunity to ask questions and all were answered. The patient agreed with the plan and demonstrated an understanding of the instructions. The patient was advised to call back or seek an in-person evaluation if the symptoms worsen or if the condition fails to improve as anticipated.  Follow up plan:  Continue Lexapro 20 mg daily for chronic anxiety.  You may take hydroxyzine 10 mg twice daily as needed for panic attacks/acute anxiety.  We will see you later in July 2023.  It was a pleasure to see you today!   Doreene Nest, NP

## 2021-09-24 NOTE — Assessment & Plan Note (Signed)
Deteriorated off Lexapro 20 mg, self resumed one week ago.  Fortunately he's has no side effects from starting Lexapro at 20 mg. Continue same.  Discussed that it will take a few weeks to notice improvement and 6 weeks to reach full effect.  Rx for hydroxyzine 10 mg provided to take BID prn. drowsiness precautions provided.   Follow up in July 2023 or sooner if neeed

## 2021-09-24 NOTE — Patient Instructions (Signed)
Continue Lexapro 20 mg daily for chronic anxiety.  You may take hydroxyzine 10 mg twice daily as needed for panic attacks/acute anxiety.  We will see you later in July 2023.  It was a pleasure to see you today!

## 2021-10-07 ENCOUNTER — Other Ambulatory Visit: Payer: Self-pay | Admitting: Primary Care

## 2021-10-07 DIAGNOSIS — F411 Generalized anxiety disorder: Secondary | ICD-10-CM

## 2021-10-07 MED ORDER — HYDROXYZINE HCL 10 MG PO TABS: 10.0000 mg | ORAL_TABLET | Freq: Two times a day (BID) | ORAL | 0 refills | Status: DC | PRN

## 2021-10-07 NOTE — Telephone Encounter (Signed)
Patient added this comment through mychart in regards to this medication " Patient Comment: I had the bottle in my car and I think it fell out of my car while outand also can you send it to [place] [facility] please"   Last filled on 09/24/21 #60 with 0 refill

## 2021-12-02 ENCOUNTER — Other Ambulatory Visit: Payer: Self-pay | Admitting: Primary Care

## 2021-12-02 DIAGNOSIS — K580 Irritable bowel syndrome with diarrhea: Secondary | ICD-10-CM

## 2021-12-02 MED ORDER — VIBERZI 75 MG PO TABS: 75.0000 mg | ORAL_TABLET | Freq: Two times a day (BID) | ORAL | 0 refills | Status: DC

## 2021-12-12 ENCOUNTER — Other Ambulatory Visit: Payer: Self-pay | Admitting: Primary Care

## 2021-12-12 DIAGNOSIS — K580 Irritable bowel syndrome with diarrhea: Secondary | ICD-10-CM

## 2021-12-12 NOTE — Telephone Encounter (Signed)
I sent refills for his Viberzi on 12/02/21 to the requested pharmacy. Is there an issue? Also, he needs to be scheduled in person for follow-up and mid-to-late July 2023. ?

## 2021-12-16 NOTE — Telephone Encounter (Signed)
Left message on voicemail to call the office back. 

## 2021-12-25 NOTE — Telephone Encounter (Signed)
Left message to return call to our office.  

## 2022-02-28 ENCOUNTER — Other Ambulatory Visit: Payer: Self-pay | Admitting: Primary Care

## 2022-02-28 DIAGNOSIS — F411 Generalized anxiety disorder: Secondary | ICD-10-CM

## 2022-04-02 ENCOUNTER — Other Ambulatory Visit: Payer: Self-pay | Admitting: Primary Care

## 2022-04-02 DIAGNOSIS — F411 Generalized anxiety disorder: Secondary | ICD-10-CM

## 2022-04-03 NOTE — Telephone Encounter (Signed)
Patient is due for CPE/follow up, this will be required prior to any further refills.  Please schedule.   

## 2022-04-04 NOTE — Telephone Encounter (Signed)
Mail box full not able to leave v/m

## 2022-04-08 ENCOUNTER — Telehealth: Payer: Self-pay | Admitting: Primary Care

## 2022-04-08 DIAGNOSIS — K580 Irritable bowel syndrome with diarrhea: Secondary | ICD-10-CM

## 2022-04-08 NOTE — Telephone Encounter (Signed)
Called patient and VM full.  

## 2022-04-08 NOTE — Telephone Encounter (Signed)
Patient is overdue for his annual office visit for medication refills.  He will need to be scheduled before we can provide additional refills.

## 2022-04-09 NOTE — Telephone Encounter (Signed)
Called patent states that he is at work right now will call back tomorrow to set up CPE. Holding to follow up and make sure we have appointment scheduled.

## 2022-04-10 MED ORDER — VIBERZI 75 MG PO TABS: 75.0000 mg | ORAL_TABLET | Freq: Two times a day (BID) | ORAL | 0 refills | Status: DC

## 2022-04-10 NOTE — Telephone Encounter (Signed)
Refills sent to pharmacy. 

## 2022-04-10 NOTE — Addendum Note (Signed)
Addended by: Doreene Nest on: 04/10/2022 05:53 PM   Modules accepted: Orders

## 2022-04-10 NOTE — Telephone Encounter (Addendum)
Patient's dad called back stating that William Singleton does not have enough to last until his upcoming appointment. Wanted to know if they can get some to last until he came on 8/18. Dad says that he believes he may have just a few pills left. Asked for it to be sent to [id number] - [place], [place] - [address]  Vonna Kotyk asked for a VM left if we are unable to provide this, patient does have dad listed on DPR.

## 2022-04-10 NOTE — Telephone Encounter (Signed)
Pt needs a refill until his appt.

## 2022-04-10 NOTE — Telephone Encounter (Signed)
Looks like patient was scheduled for 8/18

## 2022-04-25 ENCOUNTER — Ambulatory Visit: Payer: Self-pay | Admitting: Primary Care

## 2022-05-02 ENCOUNTER — Encounter: Payer: Self-pay | Admitting: Primary Care

## 2022-05-07 ENCOUNTER — Other Ambulatory Visit: Payer: Self-pay | Admitting: Primary Care

## 2022-05-07 DIAGNOSIS — F411 Generalized anxiety disorder: Secondary | ICD-10-CM

## 2022-05-12 ENCOUNTER — Encounter: Payer: Self-pay | Admitting: Primary Care

## 2022-06-13 ENCOUNTER — Encounter: Payer: Self-pay | Admitting: Primary Care

## 2022-06-18 ENCOUNTER — Encounter: Payer: Self-pay | Admitting: Primary Care

## 2022-07-04 ENCOUNTER — Encounter: Payer: Self-pay | Admitting: Primary Care

## 2022-07-04 ENCOUNTER — Ambulatory Visit (INDEPENDENT_AMBULATORY_CARE_PROVIDER_SITE_OTHER): Payer: No Typology Code available for payment source | Admitting: Primary Care

## 2022-07-04 VITALS — BP 122/88 | HR 82 | Temp 98.1°F | Ht 69.75 in | Wt 225.0 lb

## 2022-07-04 DIAGNOSIS — Z0000 Encounter for general adult medical examination without abnormal findings: Secondary | ICD-10-CM | POA: Insufficient documentation

## 2022-07-04 DIAGNOSIS — K580 Irritable bowel syndrome with diarrhea: Secondary | ICD-10-CM | POA: Diagnosis not present

## 2022-07-04 DIAGNOSIS — Z0001 Encounter for general adult medical examination with abnormal findings: Secondary | ICD-10-CM | POA: Diagnosis not present

## 2022-07-04 DIAGNOSIS — F411 Generalized anxiety disorder: Secondary | ICD-10-CM

## 2022-07-04 DIAGNOSIS — K219 Gastro-esophageal reflux disease without esophagitis: Secondary | ICD-10-CM | POA: Diagnosis not present

## 2022-07-04 DIAGNOSIS — Z114 Encounter for screening for human immunodeficiency virus [HIV]: Secondary | ICD-10-CM

## 2022-07-04 DIAGNOSIS — Z1159 Encounter for screening for other viral diseases: Secondary | ICD-10-CM

## 2022-07-04 LAB — COMPREHENSIVE METABOLIC PANEL
ALT: 38 U/L (ref 0–53)
AST: 22 U/L (ref 0–37)
Albumin: 4.4 g/dL (ref 3.5–5.2)
Alkaline Phosphatase: 78 U/L (ref 39–117)
BUN: 10 mg/dL (ref 6–23)
CO2: 29 mEq/L (ref 19–32)
Calcium: 9.4 mg/dL (ref 8.4–10.5)
Chloride: 101 mEq/L (ref 96–112)
Creatinine, Ser: 0.88 mg/dL (ref 0.40–1.50)
GFR: 120.45 mL/min (ref >60.00)
Glucose, Bld: 91 mg/dL (ref 70–99)
Potassium: 4.6 mEq/L (ref 3.5–5.1)
Sodium: 137 mEq/L (ref 135–145)
Total Bilirubin: 0.8 mg/dL (ref 0.2–1.2)
Total Protein: 7 g/dL (ref 6.0–8.3)

## 2022-07-04 LAB — CBC
HCT: 44.1 % (ref 39.0–52.0)
Hemoglobin: 15.1 g/dL (ref 13.0–17.0)
MCHC: 34.3 g/dL (ref 30.0–36.0)
MCV: 88.3 fl (ref 78.0–100.0)
Platelets: 322 10*3/uL (ref 150.0–400.0)
RBC: 5 Mil/uL (ref 4.22–5.81)
RDW: 13.2 % (ref 11.5–15.5)
WBC: 6.6 10*3/uL (ref 4.0–10.5)

## 2022-07-04 LAB — LIPID PANEL
Cholesterol: 128 mg/dL (ref 0–200)
HDL: 43.2 mg/dL (ref >39.00)
LDL Cholesterol: 73 mg/dL (ref 0–99)
NonHDL: 84.45
Total CHOL/HDL Ratio: 3
Triglycerides: 58 mg/dL (ref 0.0–149.0)
VLDL: 11.6 mg/dL (ref 0.0–40.0)

## 2022-07-04 LAB — HEMOGLOBIN A1C: Hgb A1c MFr Bld: 5 % (ref 4.6–6.5)

## 2022-07-04 MED ORDER — VIBERZI 75 MG PO TABS: 75.0000 mg | ORAL_TABLET | Freq: Two times a day (BID) | ORAL | 3 refills | Status: DC

## 2022-07-04 MED ORDER — OMEPRAZOLE 20 MG PO CPDR: 20.0000 mg | DELAYED_RELEASE_CAPSULE | Freq: Every day | ORAL | 0 refills | Status: DC

## 2022-07-04 MED ORDER — HYDROXYZINE HCL 10 MG PO TABS: ORAL_TABLET | ORAL | 0 refills | Status: DC

## 2022-07-04 MED ORDER — SERTRALINE HCL 50 MG PO TABS: 50.0000 mg | ORAL_TABLET | Freq: Every day | ORAL | 0 refills | Status: DC

## 2022-07-04 NOTE — Patient Instructions (Signed)
Stop by the lab prior to leaving today. I will notify you of your results once received.   Start sertraline (Zoloft) 50 mg for anxiety and depression. Take 1/2 tablet by mouth once daily for about one week, then increase to 1 full tablet thereafter.   Start omeprazole 20 mg daily for heartburn. Work on [employer] in the meantime.  It was a pleasure to see you today!  Food Choices for Gastroesophageal Reflux Disease, Adult When you have gastroesophageal reflux disease (GERD), the foods you eat and your eating habits are very important. Choosing the right foods can help ease your discomfort. Think about working with a food expert (dietitian) to help you make good choices. What are tips for following this plan? Reading food labels Look for foods that are low in saturated fat. Foods that may help with your symptoms include: Foods that have less than 5% of daily value (DV) of fat. Foods that have 0 grams of trans fat. Cooking Do not fry your food. Cook your food by baking, steaming, grilling, or broiling. These are all methods that do not need a lot of fat for cooking. To add flavor, try to use herbs that are low in spice and acidity. Meal planning  Choose healthy foods that are low in fat, such as: Fruits and vegetables. Whole grains. Low-fat dairy products. Lean meats, fish, and poultry. Eat small meals often instead of eating 3 large meals each day. Eat your meals slowly in a place where you are relaxed. Avoid bending over or lying down until 2-3 hours after eating. Limit high-fat foods such as fatty meats or fried foods. Limit your intake of fatty foods, such as oils, butter, and shortening. Avoid the following as told by your doctor: Foods that cause symptoms. These may be different for different people. Keep a food diary to keep track of foods that cause symptoms. Alcohol. Drinking a lot of liquid with meals. Eating meals during the 2-3 hours before bed. Lifestyle Stay at a healthy  weight. Ask your doctor what weight is healthy for you. If you need to lose weight, work with your doctor to do so safely. Exercise for at least 30 minutes on 5 or more days each week, or as told by your doctor. Wear loose-fitting clothes. Do not smoke or use any products that contain nicotine or tobacco. If you need help quitting, ask your doctor. Sleep with the head of your bed higher than your feet. Use a wedge under the mattress or blocks under the bed frame to raise the head of the bed. Chew sugar-free gum after meals. What foods should eat?  Eat a healthy, well-balanced diet of fruits, vegetables, whole grains, low-fat dairy products, lean meats, fish, and poultry. Each person is different. Foods that may cause symptoms in one person may not cause any symptoms in another person. Work with your doctor to find foods that are safe for you. The items listed above may not be a complete list of what you can eat and drink. Contact a food expert for more options. What foods should I avoid? Limiting some of these foods may help in managing the symptoms of GERD. Everyone is different. Talk with a food expert or your doctor to help you find the exact foods to avoid, if any. Fruits Any fruits prepared with added fat. Any fruits that cause symptoms. For some people, this may include citrus fruits, such as oranges, grapefruit, pineapple, and lemons. Vegetables Deep-fried vegetables. [place] fries. Any vegetables prepared  with added fat. Any vegetables that cause symptoms. For some people, this may include tomatoes and tomato products, chili peppers, onions and garlic, and horseradish. Grains Pastries or quick breads with added fat. Meats and other proteins High-fat meats, such as fatty beef or pork, hot dogs, ribs, ham, sausage, salami, and bacon. Fried meat or protein, including fried fish and fried chicken. Nuts and nut butters, in large amounts. Dairy Whole milk and chocolate milk. Sour cream. Cream.  Ice cream. Cream cheese. Milkshakes. Fats and oils Butter. Margarine. Shortening. Ghee. Beverages Coffee and tea, with or without caffeine. Carbonated beverages. Sodas. Energy drinks. Fruit juice made with acidic fruits, such as orange or grapefruit. Tomato juice. Alcoholic drinks. Sweets and desserts Chocolate and cocoa. Donuts. Seasonings and condiments Pepper. Peppermint and spearmint. Added salt. Any condiments, herbs, or seasonings that cause symptoms. For some people, this may include curry, hot sauce, or vinegar-based salad dressings. The items listed above may not be a complete list of what you should not eat and drink. Contact a food expert for more options. Questions to ask your doctor Diet and lifestyle changes are often the first steps that are taken to manage symptoms of GERD. If diet and lifestyle changes do not help, talk with your doctor about taking medicines. Where to find more information International Foundation for Gastrointestinal Disorders: aboutgerd.org Summary When you have GERD, food and lifestyle choices are very important in easing your symptoms. Eat small meals often instead of 3 large meals a day. Eat your meals slowly and in a place where you are relaxed. Avoid bending over or lying down until 2-3 hours after eating. Limit high-fat foods such as fatty meats or fried foods. This information is not intended to replace advice given to you by your health care provider. Make sure you discuss any questions you have with your health care provider. Document Revised: 03/12/2020 Document Reviewed: 03/12/2020 Elsevier Patient Education  2023 [employer].

## 2022-07-04 NOTE — Assessment & Plan Note (Signed)
Declines Tetanus, HPV, and flu vaccines.  Discussed the importance of a healthy diet and regular exercise in order for weight loss, and to reduce the risk of further co-morbidity.  Exam stable. Labs pending.  Follow up in 1 year for repeat physical.

## 2022-07-04 NOTE — Progress Notes (Signed)
Subjective:    Patient ID: HASSEN BRUUN, male    DOB: 1996/05/20, 26 y.o.   MRN: [id number]  HPI  ADLAI SINNING is a very pleasant 26 y.o. male who presents today for complete physical and follow up of chronic conditions. His father joints [place] today.  He would also like to discuss GERD. Symptoms include esophageal pressure, throat fullness, mouth watering sensation with vomiting. Over the last few months he's noticed frequent symptoms which are now occurring every day to every other day. He's been taking omeprazole 20 mg on occasion with improvement.   He would also like to switch from Lexapro to another medication as he believes that Lexapro is causing weight gain. His anxiety is overall well managed.   Diet currently consists of:  Breakfast: Skips Lunch: Fast food Dinner: Medical sales representative, fast food  Snacks: None Desserts: Candy on occasion Beverages: Water, soda, beer  Exercise: Active at work   Immunizations: -Tetanus: 2019, due today -Influenza: Declines   -HPV: Never completed. Declines today.   Eye exam: Completes annually  Dental exam: Completed years ago.    BP Readings from Last 3 Encounters:  07/04/22 122/88  03/27/21 118/72  01/04/20 128/82    Wt Readings from Last 3 Encounters:  07/04/22 225 lb (102.1 kg)  09/24/21 203 lb (92.1 kg)  03/27/21 203 lb (92.1 kg)       Review of Systems  Constitutional:  Negative for unexpected weight change.  HENT:  Negative for rhinorrhea.   Respiratory:  Negative for cough and shortness of breath.   Cardiovascular:  Negative for chest pain.  Gastrointestinal:  Negative for constipation and diarrhea.  Genitourinary:  Negative for difficulty urinating.  Musculoskeletal:  Negative for arthralgias and myalgias.  Skin:  Negative for rash.  Allergic/Immunologic: Negative for environmental allergies.  Neurological:  Negative for dizziness and headaches.  Psychiatric/Behavioral:  The patient is not nervous/anxious.           Past Medical History:  Diagnosis Date   Generalized anxiety disorder    IBS (irritable bowel syndrome)     Social History   Socioeconomic History   Marital status: Single    Spouse name: Not on file   Number of children: Not on file   Years of education: Not on file   Highest education level: Not on file  Occupational History   Not on file  Tobacco Use   Smoking status: Never   Smokeless tobacco: Never  Substance and Sexual Activity   Alcohol use: No    Alcohol/week: 0.0 standard drinks of alcohol   Drug use: No   Sexual activity: Not on file  Other Topics Concern   Not on file  Social History Narrative   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   Enjoys playing football, hanging out with friends.   Lives with father.   Social Determinants of Health   Financial Resource Strain: Not on file  Food Insecurity: Not on file  Transportation Needs: Not on file  Physical Activity: Not on file  Stress: Not on file  Social Connections: Not on file  Intimate Partner Violence: Not on file    History reviewed. No pertinent surgical history.  History reviewed. No pertinent family history.  Allergies  Allergen Reactions   Penicillins Rash    No current outpatient medications on file prior to visit.   No current facility-administered medications on file prior to visit.    BP 122/88   Pulse 82  Temp 98.1 F (36.7 C) (Temporal)   Ht 5' 9.75" (1.772 m)   Wt 225 lb (102.1 kg)   SpO2 98%   BMI 32.52 kg/m  Objective:   Physical Exam HENT:     Right Ear: Tympanic membrane and ear canal normal.     Left Ear: Tympanic membrane and ear canal normal.     Nose: Nose normal.     Right Sinus: No maxillary sinus tenderness or frontal sinus tenderness.     Left Sinus: No maxillary sinus tenderness or frontal sinus tenderness.  Eyes:     Conjunctiva/sclera: Conjunctivae normal.  Neck:     Thyroid: No thyromegaly.     Vascular: No  carotid bruit.  Cardiovascular:     Rate and Rhythm: Normal rate and regular rhythm.     Heart sounds: Normal heart sounds.  Pulmonary:     Effort: Pulmonary effort is normal.     Breath sounds: Normal breath sounds. No wheezing or rales.  Abdominal:     General: Bowel sounds are normal.     Palpations: Abdomen is soft.     Tenderness: There is no abdominal tenderness.  Musculoskeletal:        General: Normal range of motion.     Cervical back: Neck supple.  Skin:    General: Skin is warm and dry.  Neurological:     Mental Status: He is alert and oriented to person, place, and time.     Cranial Nerves: No cranial nerve deficit.     Deep Tendon Reflexes: Reflexes are normal and symmetric.  Psychiatric:        Mood and Affect: Mood normal.           Assessment & Plan:   Problem List Items Addressed This Visit       Digestive   IBS (irritable bowel syndrome)    Controlled.  Continue Viberzi 75 mg 1-2 times daily. Repeat CMP pending.      Relevant Medications   omeprazole (PRILOSEC) 20 MG capsule   Eluxadoline (VIBERZI) 75 MG TABS   Other Relevant Orders   Lipid panel   Hemoglobin A1c   Comprehensive metabolic panel   CBC   Gastroesophageal reflux disease    Likely secondary to weight gain and his poor diet.  Discussed triggers for GERD, he is eating and drinking many of them. He will work on his diet.  Start omeprazole 20 mg daily.  He will update.       Relevant Medications   omeprazole (PRILOSEC) 20 MG capsule   Eluxadoline (VIBERZI) 75 MG TABS     Other   Generalized anxiety disorder    Controlled.  Discussed that I believe his poor diet and lack of regular exercise are contributing to his weight gain. I did agree to change him to Zoloft 50 mg. He has been out of Lexapro x 4 days.   Start Zoloft 50 mg daily.  He will update.       Relevant Medications   hydrOXYzine (ATARAX) 10 MG tablet   sertraline (ZOLOFT) 50 MG tablet   Encounter for  annual general medical examination with abnormal findings in adult - Primary    Declines Tetanus, HPV, and flu vaccines.  Discussed the importance of a healthy diet and regular exercise in order for weight loss, and to reduce the risk of further co-morbidity.  Exam stable. Labs pending.  Follow up in 1 year for repeat physical.       Other Visit  Diagnoses     Screening for HIV (human immunodeficiency virus)       Relevant Orders   HIV Antibody (routine testing w rflx)   Encounter for hepatitis C screening test for low risk patient       Relevant Orders   Hepatitis C antibody          Doreene Nest, NP

## 2022-07-04 NOTE — Assessment & Plan Note (Signed)
Controlled.  Discussed that I believe his poor diet and lack of regular exercise are contributing to his weight gain. I did agree to change him to Zoloft 50 mg. He has been out of Lexapro x 4 days.   Start Zoloft 50 mg daily.  He will update.

## 2022-07-04 NOTE — Assessment & Plan Note (Signed)
Controlled.  Continue Viberzi 75 mg 1-2 times daily. Repeat CMP pending.

## 2022-07-04 NOTE — Assessment & Plan Note (Signed)
Likely secondary to weight gain and his poor diet.  Discussed triggers for GERD, he is eating and drinking many of them. He will work on his diet.  Start omeprazole 20 mg daily.  He will update.

## 2022-07-07 LAB — HIV ANTIBODY (ROUTINE TESTING W REFLEX): HIV 1&2 Ab, 4th Generation: NONREACTIVE

## 2022-07-07 LAB — HEPATITIS C ANTIBODY: Hepatitis C Ab: NONREACTIVE

## 2022-08-30 ENCOUNTER — Other Ambulatory Visit: Payer: Self-pay | Admitting: Primary Care

## 2022-08-30 DIAGNOSIS — F411 Generalized anxiety disorder: Secondary | ICD-10-CM

## 2022-09-29 ENCOUNTER — Other Ambulatory Visit: Payer: Self-pay | Admitting: Primary Care

## 2022-09-29 DIAGNOSIS — K219 Gastro-esophageal reflux disease without esophagitis: Secondary | ICD-10-CM

## 2022-09-29 NOTE — Telephone Encounter (Signed)
Please call patient:  How's he doing on the omeprazole for heartburn? Is he still taking? Is he working on his diet?

## 2022-09-30 NOTE — Telephone Encounter (Signed)
Unable to reach patient. Left voicemail to return call to our office.   

## 2022-10-01 ENCOUNTER — Other Ambulatory Visit: Payer: Self-pay | Admitting: Primary Care

## 2022-10-01 DIAGNOSIS — F411 Generalized anxiety disorder: Secondary | ICD-10-CM

## 2022-10-02 NOTE — Telephone Encounter (Signed)
Unable to reach patient. Left voicemail to return call to our office.   

## 2022-10-06 NOTE — Telephone Encounter (Signed)
Unable to reach patient. Left voicemail to return call to our office.    Called and spoke with patients mother, per dpr. She will have patient give [place] a call back.

## 2022-10-10 NOTE — Telephone Encounter (Signed)
Called and spoke with patients mother, per dpr. Advised we sent patient a mychart message, asked the mother to get patient to check message and send [place] back a response. She said she would have him contact us.

## 2022-10-11 ENCOUNTER — Other Ambulatory Visit: Payer: Self-pay | Admitting: Primary Care

## 2022-10-11 DIAGNOSIS — F411 Generalized anxiety disorder: Secondary | ICD-10-CM

## 2022-10-12 NOTE — Telephone Encounter (Signed)
Please call patient:  Received refill request for hydroxyzine for anxiety.  Last fill was in mid December. Does he actually need refills? If so, how often is he using this?

## 2022-10-13 NOTE — Telephone Encounter (Signed)
Patient dad Ulice Dash called in and stated that patient works during the day and its hard for him to answer any call. He stated that the medication is like a miracle for him, he has been taking it daily but currently he is out right now, and he stated that they are getting there with the diet. He stated the best contact number for him is [phone] but he isn't able to answer while working. He will let him know that a message was sent to him through [place] he can respond. Thank you!

## 2022-10-13 NOTE — Telephone Encounter (Signed)
Called patients dad, Ulice Dash per dpr. He states that the patient is almost out of this medication, he had a few pills left this weekend. Patients dad could not say how often he is taking the medication, but he requested this to be refilled in hopes of sending the patient both medications at the same time. Patient is out of town working in [place] and his dad has been mailing him down his medication as he gets them refilled.

## 2022-10-13 NOTE — Telephone Encounter (Signed)
See other refill request. Unable to reach patient, have left multiple voicemails. Sent patient a Pharmacist, community message, that has not been read yet. Called patients mom, per dpr advised to have patient call us back or respond via mychart. No response from patient.

## 2022-10-14 ENCOUNTER — Ambulatory Visit (INDEPENDENT_AMBULATORY_CARE_PROVIDER_SITE_OTHER)
Admission: RE | Admit: 2022-10-14 | Discharge: 2022-10-14 | Disposition: A | Discharge status: Home / Self Care | Payer: No Typology Code available for payment source | Source: Ambulatory Visit | Attending: Nurse Practitioner

## 2022-10-14 ENCOUNTER — Ambulatory Visit (INDEPENDENT_AMBULATORY_CARE_PROVIDER_SITE_OTHER): Payer: No Typology Code available for payment source | Admitting: Nurse Practitioner

## 2022-10-14 ENCOUNTER — Ambulatory Visit: Payer: No Typology Code available for payment source | Admitting: Internal Medicine

## 2022-10-14 VITALS — BP 122/88 | HR 80 | Ht 69.75 in | Wt 236.0 lb

## 2022-10-14 DIAGNOSIS — M25521 Pain in right elbow: Secondary | ICD-10-CM

## 2022-10-14 MED ORDER — NAPROXEN 500 MG PO TABS: 500.0000 mg | ORAL_TABLET | Freq: Two times a day (BID) | ORAL | 0 refills | Status: AC

## 2022-10-14 NOTE — Progress Notes (Signed)
   Acute Office Visit  Subjective:     Patient ID: William Singleton, male    DOB: 11-26-1995, 27 y.o.   MRN: [id number]  Chief Complaint  Patient presents with   Joint Swelling    Right, decreased ROM, plays a lot of ping pong    HPI Patient is in today for joint issue with a history of IBS, GERD, GAD  Right elbow pain. State noticed Saturday or Sunday. No injury that he is aware of.  Been playing lots of ping pong. States that It swollen, tight and tender to touch. States that he has tired advil that helps and last dose was last night.    Review of Systems  Constitutional:  Negative for chills and fever.  Skin:  Negative for itching and rash.  Neurological:  Positive for tingling and weakness.        Objective:    BP 122/88   Pulse 80   Ht 5' 9.75" (1.772 m)   Wt 236 lb (107 kg)   SpO2 97%   BMI 34.11 kg/m    Physical Exam Vitals and nursing note reviewed.  Constitutional:      Appearance: Normal appearance.  Cardiovascular:     Rate and Rhythm: Normal rate and regular rhythm.     Heart sounds: Normal heart sounds.  Pulmonary:     Effort: Pulmonary effort is normal.     Breath sounds: Normal breath sounds.  Musculoskeletal:        General: Swelling and tenderness present.       Arms:     Comments: Negative flexion or extension of wrist No erythema. Slight warmth   Neurological:     Mental Status: He is alert.     No results found for any visits on 10/14/22.      Assessment & Plan:   Problem List Items Addressed This Visit       Other   Right elbow pain - Primary    Ambiguous in nature.  Will treat with anti-inflammatories Naprosyn 550 mg twice daily for 10 days take with food avoid other NSAIDs.  Patient to practice RICE therapy.  Obtain x-ray pending result.  Low likelihood of septic arthritis.  Patient will also take a break from playing ping-pong is much      Relevant Medications   naproxen (NAPROSYN) 500 MG tablet   Other Relevant  Orders   DG ELBOW COMPLETE RIGHT (3+VIEW)    Meds ordered this encounter  Medications   naproxen (NAPROSYN) 500 MG tablet    Sig: Take 1 tablet (500 mg total) by mouth 2 (two) times daily with a meal for 10 days.    Dispense:  20 tablet    Refill:  0    Order Specific Question:   Supervising Provider    Answer:   TOWER, MARNE A [[id number]    Return if symptoms worsen or fail to improve.  Romilda Garret, NP

## 2022-10-14 NOTE — Patient Instructions (Signed)
Nice to see you today I have sent in some medication to take twice a day for the next 7-10 days I will be in touch with the xray once I have it Follow up if no improvement

## 2022-10-14 NOTE — Assessment & Plan Note (Signed)
Ambiguous in nature.  Will treat with anti-inflammatories Naprosyn 550 mg twice daily for 10 days take with food avoid other NSAIDs.  Patient to practice RICE therapy.  Obtain x-ray pending result.  Low likelihood of septic arthritis.  Patient will also take a break from playing ping-pong is much

## 2022-10-21 ENCOUNTER — Other Ambulatory Visit: Payer: Self-pay | Admitting: Nurse Practitioner

## 2022-10-21 DIAGNOSIS — M25521 Pain in right elbow: Secondary | ICD-10-CM

## 2023-01-09 ENCOUNTER — Other Ambulatory Visit: Payer: Self-pay | Admitting: Internal Medicine

## 2023-01-09 DIAGNOSIS — K219 Gastro-esophageal reflux disease without esophagitis: Secondary | ICD-10-CM

## 2023-01-09 NOTE — Telephone Encounter (Signed)
Unable to refill per protocol, last refill by another provide not at this practice.  Requested Prescriptions  Pending Prescriptions Disp Refills   omeprazole (PRILOSEC) 20 MG capsule [Pharmacy Med Name: OMEPRAZOLE 20MG  CAPSULES] 90 capsule 0    Sig: TAKE 1 CAPSULE(20 MG) BY MOUTH DAILY FOR HEARTBURN     There is no refill protocol information for this order

## 2023-01-20 ENCOUNTER — Other Ambulatory Visit: Payer: Self-pay | Admitting: Internal Medicine

## 2023-01-20 DIAGNOSIS — K219 Gastro-esophageal reflux disease without esophagitis: Secondary | ICD-10-CM

## 2023-01-20 DIAGNOSIS — F411 Generalized anxiety disorder: Secondary | ICD-10-CM

## 2023-01-20 NOTE — Telephone Encounter (Signed)
Unable to refill per protocol, last refill by another provider not at this practice.  Requested Prescriptions  Pending Prescriptions Disp Refills   omeprazole (PRILOSEC) 20 MG capsule [Pharmacy Med Name: OMEPRAZOLE 20MG  CAPSULES] 90 capsule 0    Sig: TAKE 1 CAPSULE(20 MG) BY MOUTH DAILY FOR HEARTBURN     There is no refill protocol information for this order     hydrOXYzine (ATARAX) 10 MG tablet [Pharmacy Med Name: HYDROXYZINE HCL 10MG  TABLETS] 180 tablet 0    Sig: TAKE 1 TABLET(10 MG) BY MOUTH TWICE DAILY AS NEEDED FOR ANXIETY     There is no refill protocol information for this order

## 2023-01-20 NOTE — Telephone Encounter (Signed)
Please call patient:  Received refill request for 2 of his medications.  Is he still taking the hydroxyzine medication for anxiety? If so, how often is he taking this and does he need a refill?  Is he still taking the omeprazole 20 mg medication for heartburn? Does he feel like he needs to continue the medication?

## 2023-01-21 NOTE — Telephone Encounter (Signed)
Unable to reach patient. Left voicemail to return call to our office.   

## 2023-01-22 NOTE — Telephone Encounter (Signed)
Unable to reach patient. Left voicemail to return call to our office.   

## 2023-01-23 NOTE — Telephone Encounter (Signed)
Unable to reach patient. Left voicemail to return call to our office.   3rd attempt sending mychart message and mailing letter

## 2023-01-26 ENCOUNTER — Other Ambulatory Visit: Payer: Self-pay | Admitting: Primary Care

## 2023-01-26 DIAGNOSIS — K219 Gastro-esophageal reflux disease without esophagitis: Secondary | ICD-10-CM

## 2023-01-26 DIAGNOSIS — F411 Generalized anxiety disorder: Secondary | ICD-10-CM

## 2023-01-26 MED ORDER — HYDROXYZINE HCL 10 MG PO TABS: 10.0000 mg | ORAL_TABLET | Freq: Two times a day (BID) | ORAL | 0 refills | Status: DC | PRN

## 2023-01-26 MED ORDER — OMEPRAZOLE 20 MG PO CPDR: 20.0000 mg | DELAYED_RELEASE_CAPSULE | Freq: Every day | ORAL | 0 refills | Status: DC

## 2023-01-26 NOTE — Telephone Encounter (Signed)
From: Althia Forts To: Office of Doreene Nest, NP Sent: 01/25/2023 12:40 PM EDT Subject: Medication Renewal Request  Refills have been requested for the following medications:   omeprazole (PRILOSEC) 20 MG capsule [Carmencita Cusic K Lemuel Boodram]   hydrOXYzine (ATARAX) 10 MG tablet [Calan Doren K Corissa Oguinn]  Preferred pharmacy: [id number] Nicholes Rough, [place] - [address] Delivery method: [employer]

## 2023-04-16 ENCOUNTER — Other Ambulatory Visit: Payer: Self-pay | Admitting: Primary Care

## 2023-04-16 DIAGNOSIS — K219 Gastro-esophageal reflux disease without esophagitis: Secondary | ICD-10-CM

## 2023-04-16 DIAGNOSIS — F411 Generalized anxiety disorder: Secondary | ICD-10-CM

## 2023-04-17 MED ORDER — OMEPRAZOLE 20 MG PO CPDR: 20.0000 mg | DELAYED_RELEASE_CAPSULE | Freq: Every day | ORAL | 0 refills | Status: DC

## 2023-04-17 MED ORDER — HYDROXYZINE HCL 10 MG PO TABS: 10.0000 mg | ORAL_TABLET | Freq: Two times a day (BID) | ORAL | 0 refills | Status: DC | PRN

## 2023-04-22 ENCOUNTER — Telehealth: Payer: Self-pay | Admitting: Primary Care

## 2023-04-22 NOTE — Telephone Encounter (Signed)
A 1 year supply of this medication was sent to his [facility] pharmacy in [place] in October 2023.  He should not be out of his medication.  Is he taking his medication once daily?  Twice daily?  Even by taking it twice daily he should still have enough on hand.  He will also need to be seen for follow-up in October 2023 for continued refills.  His last CPE was 07/04/2022.

## 2023-04-22 NOTE — Telephone Encounter (Signed)
Prescription Request  04/22/2023  LOV: 07/04/2022  What is the name of the medication or equipment? Eluxadoline (VIBERZI) 75 MG TABS   Have you contacted your pharmacy to request a refill? Yes   Which pharmacy would you like this sent to?   [facility] #[id number] Jinny Blossom, [place] - [address] AT [facility] [address] [place] [address] Phone: [phone] Fax: [phone]  Patient notified that their request is being sent to the clinical staff for review and that they should receive a response within 2 business days.   Please advise at Mobile [phone] (mobile)

## 2023-04-23 DIAGNOSIS — K580 Irritable bowel syndrome with diarrhea: Secondary | ICD-10-CM

## 2023-04-23 NOTE — Telephone Encounter (Signed)
See my chart message

## 2023-04-23 NOTE — Telephone Encounter (Signed)
Noted.  Please call patient and ask him if he has been without his Viberzi since April? If so, how are his symptoms off the medication?  We can refill the medication, but I need to know how he is doing.  He will need to be seen in October for continued refills.  Last CPE was 07/04/2022.

## 2023-04-23 NOTE — Telephone Encounter (Signed)
Please call patient's pharmacy in [place] to find out what is going on with his Viberzi medication.  I sent a 1 year supply to this pharmacy in October 2023.  He is saying that they did not have his medication in April?

## 2023-04-23 NOTE — Telephone Encounter (Signed)
Called and spoke with pharmacist they stated due to the drug being a controlled substance they need a new Rx every 6 months.  Please send in new Script and they will fill.

## 2023-04-23 NOTE — Telephone Encounter (Signed)
Unable to reach patient, unable to leave voicemail, mailbox full.

## 2023-04-24 NOTE — Telephone Encounter (Signed)
Tried to call pt but VM is full. William Singleton did send a message in MyChart to pt yesterday.

## 2023-04-27 NOTE — Telephone Encounter (Signed)
Called patient not able to leave message voicemail full.

## 2023-04-28 MED ORDER — VIBERZI 75 MG PO TABS: 75.0000 mg | ORAL_TABLET | Freq: Every day | ORAL | 0 refills | Status: DC

## 2023-07-17 ENCOUNTER — Other Ambulatory Visit: Payer: Self-pay

## 2023-07-17 DIAGNOSIS — F411 Generalized anxiety disorder: Secondary | ICD-10-CM

## 2023-07-17 DIAGNOSIS — K219 Gastro-esophageal reflux disease without esophagitis: Secondary | ICD-10-CM

## 2023-07-17 MED ORDER — SERTRALINE HCL 50 MG PO TABS: 50.0000 mg | ORAL_TABLET | Freq: Every day | ORAL | 0 refills | Status: DC

## 2023-07-17 MED ORDER — OMEPRAZOLE 20 MG PO CPDR: 20.0000 mg | DELAYED_RELEASE_CAPSULE | Freq: Every day | ORAL | 0 refills | Status: DC

## 2023-07-30 ENCOUNTER — Other Ambulatory Visit: Payer: Self-pay | Admitting: Primary Care

## 2023-07-30 DIAGNOSIS — K219 Gastro-esophageal reflux disease without esophagitis: Secondary | ICD-10-CM

## 2023-07-30 DIAGNOSIS — F411 Generalized anxiety disorder: Secondary | ICD-10-CM

## 2023-07-31 ENCOUNTER — Other Ambulatory Visit: Payer: Self-pay

## 2023-07-31 DIAGNOSIS — F411 Generalized anxiety disorder: Secondary | ICD-10-CM

## 2023-08-20 DIAGNOSIS — K580 Irritable bowel syndrome with diarrhea: Secondary | ICD-10-CM

## 2023-08-20 DIAGNOSIS — F411 Generalized anxiety disorder: Secondary | ICD-10-CM

## 2023-08-27 MED ORDER — VIBERZI 75 MG PO TABS: 75.0000 mg | ORAL_TABLET | Freq: Every day | ORAL | 0 refills | Status: DC

## 2023-08-27 MED ORDER — HYDROXYZINE HCL 10 MG PO TABS: 10.0000 mg | ORAL_TABLET | Freq: Two times a day (BID) | ORAL | 0 refills | Status: DC | PRN

## 2023-08-29 ENCOUNTER — Other Ambulatory Visit: Payer: Self-pay | Admitting: Primary Care

## 2023-08-29 DIAGNOSIS — F411 Generalized anxiety disorder: Secondary | ICD-10-CM

## 2023-08-29 DIAGNOSIS — K580 Irritable bowel syndrome with diarrhea: Secondary | ICD-10-CM

## 2023-08-31 ENCOUNTER — Other Ambulatory Visit: Payer: Self-pay

## 2023-08-31 DIAGNOSIS — K219 Gastro-esophageal reflux disease without esophagitis: Secondary | ICD-10-CM

## 2023-08-31 DIAGNOSIS — F411 Generalized anxiety disorder: Secondary | ICD-10-CM

## 2023-08-31 MED ORDER — OMEPRAZOLE 20 MG PO CPDR: 20.0000 mg | DELAYED_RELEASE_CAPSULE | Freq: Every day | ORAL | 0 refills | Status: DC

## 2023-08-31 MED ORDER — SERTRALINE HCL 50 MG PO TABS: 50.0000 mg | ORAL_TABLET | Freq: Every day | ORAL | 0 refills | Status: DC

## 2023-09-18 ENCOUNTER — Ambulatory Visit: Payer: No Typology Code available for payment source | Admitting: Primary Care

## 2023-09-25 ENCOUNTER — Ambulatory Visit: Payer: No Typology Code available for payment source | Admitting: Primary Care

## 2023-10-02 ENCOUNTER — Encounter: Payer: Self-pay | Admitting: Primary Care

## 2023-10-02 ENCOUNTER — Ambulatory Visit (INDEPENDENT_AMBULATORY_CARE_PROVIDER_SITE_OTHER): Payer: BC Managed Care – PPO | Admitting: Primary Care

## 2023-10-02 VITALS — BP 140/88 | HR 78 | Temp 97.3°F | Ht 69.75 in | Wt 217.0 lb

## 2023-10-02 DIAGNOSIS — K580 Irritable bowel syndrome with diarrhea: Secondary | ICD-10-CM

## 2023-10-02 DIAGNOSIS — F411 Generalized anxiety disorder: Secondary | ICD-10-CM

## 2023-10-02 DIAGNOSIS — K219 Gastro-esophageal reflux disease without esophagitis: Secondary | ICD-10-CM | POA: Diagnosis not present

## 2023-10-02 DIAGNOSIS — Z0001 Encounter for general adult medical examination with abnormal findings: Secondary | ICD-10-CM

## 2023-10-02 DIAGNOSIS — Z0000 Encounter for general adult medical examination without abnormal findings: Secondary | ICD-10-CM

## 2023-10-02 MED ORDER — OMEPRAZOLE 20 MG PO CPDR: 20.0000 mg | DELAYED_RELEASE_CAPSULE | Freq: Every day | ORAL | 0 refills | Status: DC

## 2023-10-02 MED ORDER — HYDROXYZINE PAMOATE 25 MG PO CAPS: 25.0000 mg | ORAL_CAPSULE | Freq: Two times a day (BID) | ORAL | 0 refills | Status: DC | PRN

## 2023-10-02 MED ORDER — SERTRALINE HCL 100 MG PO TABS: 100.0000 mg | ORAL_TABLET | Freq: Every day | ORAL | 0 refills | Status: DC

## 2023-10-02 NOTE — Patient Instructions (Signed)
We increased the dose of your Zoloft anxiety medication to 100 mg once daily.  I sent a new prescription to your pharmacy.  We increased the dose of your hydroxyzine to 25 mg for anxiety.  You may take this twice daily as needed.  Use sparingly.  Monitor your blood pressure.  It should run less than 140 on top and less than 90 on bottom.  It was a pleasure to see you today!

## 2023-10-02 NOTE — Assessment & Plan Note (Addendum)
Controlled.  Continue omeprazole 20 mg daily. Commended him on weight loss. Encouraged to continue.

## 2023-10-02 NOTE — Progress Notes (Signed)
Subjective:    Patient ID: William Singleton, male    DOB: 07-Mar-1996, 28 y.o.   MRN: [id number]  HPI  William Singleton is a very pleasant 28 y.o. male who presents today for complete physical and follow up of chronic conditions.  His father questions if he's on the right treatment for anxiety. He is taking hydroxyzine 10 mg twice daily most everyday to prevent an anxiety attack. Symptoms include shortness of breath, chest tightness, feeling sweaty. He is compliant to Zoloft 50 mg daily, he feels about the same as he did on Lexapro.   Immunizations: -Tetanus: Completed in 2009 -Influenza: Declines influenza vaccine.   Diet: Fair diet.  Exercise: Active   Eye exam: Completed years ago.  Dental exam: Completes semi-annually      BP Readings from Last 3 Encounters:  10/02/23 (!) 140/88  10/14/22 122/88  07/04/22 122/88   Wt Readings from Last 3 Encounters:  10/02/23 217 lb (98.4 kg)  10/14/22 236 lb (107 kg)  07/04/22 225 lb (102.1 kg)       Review of Systems  Constitutional:  Negative for unexpected weight change.  HENT:  Negative for rhinorrhea.   Respiratory:  Negative for cough and shortness of breath.   Cardiovascular:  Negative for chest pain.  Gastrointestinal:  Negative for constipation and diarrhea.  Genitourinary:  Negative for difficulty urinating.  Musculoskeletal:  Negative for arthralgias and myalgias.  Skin:  Negative for rash.  Allergic/Immunologic: Negative for environmental allergies.  Neurological:  Negative for dizziness, numbness and headaches.  Psychiatric/Behavioral:  The patient is nervous/anxious.        See HPI         Past Medical History:  Diagnosis Date   Generalized anxiety disorder    IBS (irritable bowel syndrome)     Social History   Socioeconomic History   Marital status: Single    Spouse name: Not on file   Number of children: Not on file   Years of education: Not on file   Highest education level: Not on file   Occupational History   Not on file  Tobacco Use   Smoking status: Never   Smokeless tobacco: Never  Substance and Sexual Activity   Alcohol use: No    Alcohol/week: 0.0 standard drinks of alcohol   Drug use: No   Sexual activity: Not on file  Other Topics Concern   Not on file  Social History Narrative   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   Enjoys playing football, hanging out with friends.   Lives with father.   Social Drivers of Corporate investment banker Strain: Not on file  Food Insecurity: Not on file  Transportation Needs: Not on file  Physical Activity: Not on file  Stress: Not on file  Social Connections: Not on file  Intimate Partner Violence: Not on file    History reviewed. No pertinent surgical history.  History reviewed. No pertinent family history.  Allergies  Allergen Reactions   Penicillins Rash    Current Outpatient Medications on File Prior to Visit  Medication Sig Dispense Refill   Eluxadoline (VIBERZI) 75 MG TABS Take 1 tablet (75 mg total) by mouth daily. For IBS. 30 tablet 0   omeprazole (PRILOSEC) 20 MG capsule Take 1 capsule (20 mg total) by mouth daily. for heartburn. 30 capsule 0   No current facility-administered medications on file prior to visit.    BP (!) 140/88   Pulse  78   Temp (!) 97.3 F (36.3 C) (Temporal)   Ht 5' 9.75" (1.772 m)   Wt 217 lb (98.4 kg)   SpO2 99%   BMI 31.36 kg/m  Objective:   Physical Exam HENT:     Right Ear: Tympanic membrane and ear canal normal.     Left Ear: Tympanic membrane and ear canal normal.  Eyes:     Pupils: Pupils are equal, round, and reactive to light.  Cardiovascular:     Rate and Rhythm: Normal rate and regular rhythm.  Pulmonary:     Effort: Pulmonary effort is normal.     Breath sounds: Normal breath sounds.  Abdominal:     General: Bowel sounds are normal.     Palpations: Abdomen is soft.     Tenderness: There is no abdominal tenderness.   Musculoskeletal:        General: Normal range of motion.     Cervical back: Neck supple.  Skin:    General: Skin is warm and dry.  Neurological:     Mental Status: He is alert and oriented to person, place, and time.     Cranial Nerves: No cranial nerve deficit.     Deep Tendon Reflexes:     Reflex Scores:      Patellar reflexes are 2+ on the right side and 2+ on the left side. Psychiatric:        Mood and Affect: Mood normal.           Assessment & Plan:  Encounter for annual general medical examination with abnormal findings in adult Assessment & Plan: Declines influenza vaccine and tetanus vaccines today.  Discussed the importance of a healthy diet and regular exercise in order for weight loss, and to reduce the risk of further co-morbidity.  Exam stable. Labs reviewed from November 2024 through media tab on chart.  Follow up in 1 year for repeat physical.    Generalized anxiety disorder Assessment & Plan: Deteriorated with frequent use of hydroxyzine.  Discussed options today.  We will increase Zoloft to 100 mg daily and hydroxyzine to 25 mg twice daily as needed.  We discussed that the purpose of hydroxyzine is to use only as needed.  He will update.  Orders: -     hydrOXYzine Pamoate; Take 1 capsule (25 mg total) by mouth 2 (two) times daily as needed for anxiety.  Dispense: 180 capsule; Refill: 0 -     Sertraline HCl; Take 1 tablet (100 mg total) by mouth daily. For anxiety  Dispense: 90 tablet; Refill: 0  Gastroesophageal reflux disease, unspecified whether esophagitis present Assessment & Plan: Controlled.  Continue omeprazole 20 mg daily. Commended him on weight loss. Encouraged to continue.     Irritable bowel syndrome with diarrhea Assessment & Plan: Controlled.  Continue Viberzi 75 mg once daily. Reviewed lab report from November 2024 through media section on his chart.         Doreene Nest, NP

## 2023-10-02 NOTE — Assessment & Plan Note (Signed)
Controlled.  Continue Viberzi 75 mg once daily. Reviewed lab report from November 2024 through media section on his chart.

## 2023-10-02 NOTE — Assessment & Plan Note (Signed)
Deteriorated with frequent use of hydroxyzine.  Discussed options today.  We will increase Zoloft to 100 mg daily and hydroxyzine to 25 mg twice daily as needed.  We discussed that the purpose of hydroxyzine is to use only as needed.  He will update.

## 2023-10-02 NOTE — Assessment & Plan Note (Signed)
Declines influenza vaccine and tetanus vaccines today.  Discussed the importance of a healthy diet and regular exercise in order for weight loss, and to reduce the risk of further co-morbidity.  Exam stable. Labs reviewed from November 2024 through media tab on chart.  Follow up in 1 year for repeat physical.

## 2023-10-19 ENCOUNTER — Telehealth: Payer: Self-pay | Admitting: Nurse Practitioner

## 2023-10-19 ENCOUNTER — Encounter: Payer: Self-pay | Admitting: Nurse Practitioner

## 2023-10-19 ENCOUNTER — Ambulatory Visit (INDEPENDENT_AMBULATORY_CARE_PROVIDER_SITE_OTHER): Payer: BC Managed Care – PPO | Admitting: Nurse Practitioner

## 2023-10-19 VITALS — BP 136/90 | HR 85 | Temp 97.8°F | Ht 69.75 in | Wt 212.6 lb

## 2023-10-19 DIAGNOSIS — F411 Generalized anxiety disorder: Secondary | ICD-10-CM | POA: Diagnosis not present

## 2023-10-19 DIAGNOSIS — R030 Elevated blood-pressure reading, without diagnosis of hypertension: Secondary | ICD-10-CM | POA: Diagnosis not present

## 2023-10-19 DIAGNOSIS — K580 Irritable bowel syndrome with diarrhea: Secondary | ICD-10-CM

## 2023-10-19 MED ORDER — BUSPIRONE HCL 5 MG PO TABS: 5.0000 mg | ORAL_TABLET | Freq: Two times a day (BID) | ORAL | 1 refills | Status: DC

## 2023-10-19 MED ORDER — VIBERZI 75 MG PO TABS: 75.0000 mg | ORAL_TABLET | Freq: Every day | ORAL | 0 refills | Status: DC

## 2023-10-19 NOTE — Assessment & Plan Note (Signed)
Patient currently maintained on Zoloft 100 mg daily and hydroxyzine 25 mg as needed.  Patient still not well-controlled.  Will add on buspirone 5 mg twice daily continue Zoloft 100 mg daily and hydroxyzine as needed.  Close follow-up with PCP patient denies HI/SI/AVH.

## 2023-10-19 NOTE — Addendum Note (Signed)
Addended by: Doreene Nest on: 10/19/2023 06:04 PM   Modules accepted: Orders

## 2023-10-19 NOTE — Telephone Encounter (Signed)
Please call patient:  Let him know that I sent a refill of Viberzi to the [facility] pharmacy. Not sure which pharmacy to send.

## 2023-10-19 NOTE — Telephone Encounter (Signed)
Saw patient in office and he is requesting a refill on viberzi. Patient is requesting a 90 day if possible

## 2023-10-19 NOTE — Patient Instructions (Signed)
Nice to see you today I have sent in buspar to the pharmacy  Follow up with Jae Dire in 3 weeks I have made a referral to psychiatry

## 2023-10-19 NOTE — Progress Notes (Signed)
Established Patient Office Visit  Subjective   Patient ID: William Singleton, male    DOB: Nov 21, 1995  Age: 28 y.o. MRN: [id number]  Chief Complaint  Patient presents with   Anxiety    Pt complains of medication making anxiety worse. States its not working and if affecting job life. Pt states t   Medication Refill    Viberizi       GAD: patient was seen by PCP on 10/02/2023 and they dicussed the anxiety. He was taking 2 hydroxyzine and was adherent to the zoloft. He was increased on zoloft from 50mg  to 100mg . He is here for a follow up.  State that he feels tight in his chest and feels like he is always out of breath. States that he had to leave work early and missed Sunday. States that he was taking 2 hydroxyzines and it is not helping. States that he is sleeping kinda. He does run a restaurant at night and he will go to bed aroun 12-2 and get up around noon. States that he will stay up later sometimes but that is because he is doing something  Shob: states that feels like a heaviness on the chest. It is intermittment. Does not get worse with activity. States that it is better when he gets his mind off of it. Food and drink does not make a differecne. No positions make it differnece. Takes omeprazole everyday and it controls the heartburn      10/19/2023    9:09 AM 10/02/2023    9:47 AM 07/04/2022   12:41 PM  PHQ9 SCORE ONLY  PHQ-9 Total Score 3 0 2        10/19/2023    9:09 AM 07/04/2022   12:42 PM 09/24/2021   11:33 AM 09/04/2016   12:06 PM  GAD 7 : Generalized Anxiety Score  Nervous, Anxious, on Edge 2 1 1 3   Control/stop worrying 0 0 2 1  Worry too much - different things 0 0 2 2  Trouble relaxing 1 0 2 2  Restless 0 0 0 2  Easily annoyed or irritable 0 0 0 2  Afraid - awful might happen 1 0 0 1  Total GAD 7 Score 4 1 7 13   Anxiety Difficulty Somewhat difficult Not difficult at all Somewhat difficult Somewhat difficult        Review of Systems  Constitutional:   Negative for chills and fever.  Respiratory:  Negative for shortness of breath.   Cardiovascular:  Negative for chest pain.  Gastrointestinal:  Positive for diarrhea. Negative for abdominal pain, constipation, nausea and vomiting.  Neurological:  Negative for dizziness and headaches.  Psychiatric/Behavioral:  Negative for hallucinations and suicidal ideas.       Objective:     BP (!) 136/90   Pulse 85   Temp 97.8 F (36.6 C) (Oral)   Ht 5' 9.75" (1.772 m)   Wt 212 lb 9.6 oz (96.4 kg)   SpO2 96%   BMI 30.72 kg/m  BP Readings from Last 3 Encounters:  10/19/23 (!) 136/90  10/02/23 (!) 140/88  10/14/22 122/88   Wt Readings from Last 3 Encounters:  10/19/23 212 lb 9.6 oz (96.4 kg)  10/02/23 217 lb (98.4 kg)  10/14/22 236 lb (107 kg)   SpO2 Readings from Last 3 Encounters:  10/19/23 96%  10/02/23 99%  10/14/22 97%      Physical Exam Vitals and nursing note reviewed.  Constitutional:      Appearance: Normal appearance.  Cardiovascular:     Rate and Rhythm: Normal rate and regular rhythm.     Heart sounds: Normal heart sounds.  Pulmonary:     Effort: Pulmonary effort is normal.     Breath sounds: Normal breath sounds.  Neurological:     Mental Status: He is alert.      No results found for any visits on 10/19/23.    The ASCVD Risk score (Arnett DK, et al., 2019) failed to calculate for the following reasons:   The 2019 ASCVD risk score is only valid for ages 6 to 16    Assessment & Plan:   Problem List Items Addressed This Visit       Other   Generalized anxiety disorder - Primary   Patient currently maintained on Zoloft 100 mg daily and hydroxyzine 25 mg as needed.  Patient still not well-controlled.  Will add on buspirone 5 mg twice daily continue Zoloft 100 mg daily and hydroxyzine as needed.  Close follow-up with PCP patient denies HI/SI/AVH.      Relevant Medications   busPIRone (BUSPAR) 5 MG tablet   Other Relevant Orders   Ambulatory referral  to Psychiatry   Elevated blood pressure reading   Second blood pressure reading in office.  Likely secondary to anxiety.  Will continue to recheck stable this juncture.  If continued elevation will be increased episodes of anxiety may benefit from further workup to rule out pheochromocytoma       Return in about 3 weeks (around 11/09/2023) for GAD recheck with Mayra Reel .    Audria Nine, NP

## 2023-10-19 NOTE — Assessment & Plan Note (Signed)
Second blood pressure reading in office.  Likely secondary to anxiety.  Will continue to recheck stable this juncture.  If continued elevation will be increased episodes of anxiety may benefit from further workup to rule out pheochromocytoma

## 2023-10-20 NOTE — Telephone Encounter (Signed)
 Unable to reach patient. Left voicemail to return call to our office.

## 2023-10-21 NOTE — Telephone Encounter (Signed)
 Unable to reach patient. Left voicemail to return call to our office.

## 2023-11-09 ENCOUNTER — Telehealth: Payer: BC Managed Care – PPO | Admitting: Primary Care

## 2023-11-12 ENCOUNTER — Telehealth (INDEPENDENT_AMBULATORY_CARE_PROVIDER_SITE_OTHER): Payer: BC Managed Care – PPO | Admitting: Primary Care

## 2023-11-12 ENCOUNTER — Encounter: Payer: Self-pay | Admitting: Primary Care

## 2023-11-12 DIAGNOSIS — F411 Generalized anxiety disorder: Secondary | ICD-10-CM | POA: Diagnosis not present

## 2023-11-12 MED ORDER — BUSPIRONE HCL 5 MG PO TABS: 5.0000 mg | ORAL_TABLET | Freq: Two times a day (BID) | ORAL | 1 refills | Status: DC

## 2023-11-12 NOTE — Assessment & Plan Note (Signed)
 Improved.  Continue Zoloft 100 mg daily, buspirone 5 mg twice daily, hydroxyzine 25 mg as needed. He will update if anything changes.

## 2023-11-12 NOTE — Patient Instructions (Signed)
 Continue Zoloft 100 mg daily for anxiety. Continue buspirone 5 mg twice daily for anxiety. Use hydroxyzine only if needed.  Please update me if anything changes.

## 2023-11-12 NOTE — Progress Notes (Signed)
 Patient ID: William Singleton, male    DOB: 10-30-1995, 28 y.o.   MRN: [id number]  Virtual visit completed through caregility, a video enabled telemedicine application. Due to national recommendations of social distancing due to COVID-19, a virtual visit is felt to be most appropriate for this patient at this time. Reviewed limitations, risks, security and privacy concerns of performing a virtual visit and the availability of in person appointments. I also reviewed that there may be a patient responsible charge related to this service. The patient agreed to proceed.   Patient location: vehicle Provider location: [place] [facility], office Persons participating in this virtual visit: patient, provider   If any vitals were documented, they were collected by patient at home unless specified below.    Ht 5\' 11"  (1.803 m) Comment: Per patient  Wt 210 lb (95.3 kg) Comment: Per patient  BMI 29.29 kg/m    CC: Follow up for anxiety Subjective:   HPI: William Singleton is a 28 y.o. male with a history of GAD, IBS, GERD presenting on 11/12/2023 for follow-up of anxiety.  No chief complaint on file.  He was last evaluated by Susy Frizzle, NP on 10/19/2023 for increased anxiety despite management on Zoloft 100 mg that was increased 2 weeks prior.  Symptoms including chest tightness, shortness of breath, feeling anxious.  During this visit his Zoloft 100 mg daily was continued, hydroxyzine 25 mg as needed was continued, buspirone 5 mg twice daily was added.  He is here for follow-up today.  Since his last visit he's feeling better. His chest tightness has resolved. He's feeling less anxious. He's not having to take hydroxyzine as often. He is taking Zoloft 100 mg daily and Buspirone 5 mg BID. He stopped drinking alcohol, has been sober for 25 days. Thinks this has helped.       Relevant past medical, surgical, family and social history reviewed and updated as indicated. Interim medical history since  our last visit reviewed. Allergies and medications reviewed and updated. Outpatient Medications Prior to Visit  Medication Sig Dispense Refill   Eluxadoline (VIBERZI) 75 MG TABS Take 1 tablet (75 mg total) by mouth daily. For IBS. 90 tablet 0   hydrOXYzine (VISTARIL) 25 MG capsule Take 1 capsule (25 mg total) by mouth 2 (two) times daily as needed for anxiety. 180 capsule 0   omeprazole (PRILOSEC) 20 MG capsule Take 1 capsule (20 mg total) by mouth daily. for heartburn. 90 capsule 0   sertraline (ZOLOFT) 100 MG tablet Take 1 tablet (100 mg total) by mouth daily. For anxiety 90 tablet 0   busPIRone (BUSPAR) 5 MG tablet Take 1 tablet (5 mg total) by mouth 2 (two) times daily. 60 tablet 1   No facility-administered medications prior to visit.     Per HPI unless specifically indicated in ROS section below Review of Systems  Respiratory:  Negative for shortness of breath.   Cardiovascular:  Negative for chest pain.  Psychiatric/Behavioral:         See HPI   Objective:  Ht 5\' 11"  (1.803 m) Comment: Per patient  Wt 210 lb (95.3 kg) Comment: Per patient  BMI 29.29 kg/m   Wt Readings from Last 3 Encounters:  11/12/23 210 lb (95.3 kg)  10/19/23 212 lb 9.6 oz (96.4 kg)  10/02/23 217 lb (98.4 kg)       Physical exam: General: Alert and oriented x 3, no distress, does not appear sickly  Pulmonary: Speaks in complete sentences without  increased work of breathing, no cough during visit.  Psychiatric: Normal mood, thought content, and behavior.     Results for orders placed or performed in visit on 07/04/22  HIV Antibody (routine testing w rflx)   Collection Time: 07/04/22 12:18 PM  Result Value Ref Range   HIV 1&2 Ab, 4th Generation NON-REACTIVE NON-REACTIVE  Hepatitis C antibody   Collection Time: 07/04/22 12:18 PM  Result Value Ref Range   Hepatitis C Ab NON-REACTIVE NON-REACTIVE  Lipid panel   Collection Time: 07/04/22 12:18 PM  Result Value Ref Range   Cholesterol 128 0 - 200  mg/dL   Triglycerides 16.1 0.0 - 149.0 mg/dL   HDL 09.60 >45.40 mg/dL   VLDL 98.1 0.0 - 19.1 mg/dL   LDL Cholesterol 73 0 - 99 mg/dL   Total CHOL/HDL Ratio 3    NonHDL 84.45   Hemoglobin A1c   Collection Time: 07/04/22 12:18 PM  Result Value Ref Range   Hgb A1c MFr Bld 5.0 4.6 - 6.5 %  Comprehensive metabolic panel   Collection Time: 07/04/22 12:18 PM  Result Value Ref Range   Sodium 137 135 - 145 mEq/L   Potassium 4.6 3.5 - 5.1 mEq/L   Chloride 101 96 - 112 mEq/L   CO2 29 19 - 32 mEq/L   Glucose, Bld 91 70 - 99 mg/dL   BUN 10 6 - 23 mg/dL   Creatinine, Ser 4.78 0.40 - 1.50 mg/dL   Total Bilirubin 0.8 0.2 - 1.2 mg/dL   Alkaline Phosphatase 78 39 - 117 U/L   AST 22 0 - 37 U/L   ALT 38 0 - 53 U/L   Total Protein 7.0 6.0 - 8.3 g/dL   Albumin 4.4 3.5 - 5.2 g/dL   GFR 295.62 >13.08 mL/min   Calcium 9.4 8.4 - 10.5 mg/dL  CBC   Collection Time: 07/04/22 12:18 PM  Result Value Ref Range   WBC 6.6 4.0 - 10.5 K/uL   RBC 5.00 4.22 - 5.81 Mil/uL   Platelets 322.0 150.0 - 400.0 K/uL   Hemoglobin 15.1 13.0 - 17.0 g/dL   HCT 65.7 84.6 - 96.2 %   MCV 88.3 78.0 - 100.0 fl   MCHC 34.3 30.0 - 36.0 g/dL   RDW 95.2 84.1 - 32.4 %   Assessment & Plan:   Problem List Items Addressed This Visit       Other   Generalized anxiety disorder   Improved.  Continue Zoloft 100 mg daily, buspirone 5 mg twice daily, hydroxyzine 25 mg as needed. He will update if anything changes.      Relevant Medications   busPIRone (BUSPAR) 5 MG tablet     Meds ordered this encounter  Medications   busPIRone (BUSPAR) 5 MG tablet    Sig: Take 1 tablet (5 mg total) by mouth 2 (two) times daily. For anxiety    Dispense:  180 tablet    Refill:  1    Supervising Provider:   BEDSOLE, AMY E [[id number]]   No orders of the defined types were placed in this encounter.   I discussed the assessment and treatment plan with the patient. The patient was provided an opportunity to ask questions and all were answered.  The patient agreed with the plan and demonstrated an understanding of the instructions. The patient was advised to call back or seek an in-person evaluation if the symptoms worsen or if the condition fails to improve as anticipated.  Follow up plan:  Continue Zoloft 100 mg  daily for anxiety. Continue buspirone 5 mg twice daily for anxiety. Use hydroxyzine only if needed.  Please update me if anything changes.  Doreene Nest, NP

## 2023-12-22 ENCOUNTER — Other Ambulatory Visit: Payer: Self-pay | Admitting: Primary Care

## 2023-12-22 DIAGNOSIS — F411 Generalized anxiety disorder: Secondary | ICD-10-CM

## 2023-12-22 DIAGNOSIS — K219 Gastro-esophageal reflux disease without esophagitis: Secondary | ICD-10-CM

## 2023-12-22 DIAGNOSIS — K580 Irritable bowel syndrome with diarrhea: Secondary | ICD-10-CM

## 2023-12-22 MED ORDER — OMEPRAZOLE 20 MG PO CPDR
20.0000 mg | DELAYED_RELEASE_CAPSULE | Freq: Every day | ORAL | 2 refills | Status: DC
Start: 2023-12-22 — End: 2024-04-13

## 2023-12-22 MED ORDER — HYDROXYZINE PAMOATE 25 MG PO CAPS: 25.0000 mg | ORAL_CAPSULE | Freq: Two times a day (BID) | ORAL | 2 refills | Status: AC | PRN

## 2023-12-22 MED ORDER — SERTRALINE HCL 100 MG PO TABS
100.0000 mg | ORAL_TABLET | Freq: Every day | ORAL | 2 refills | Status: DC
Start: 2023-12-22 — End: 2024-04-13

## 2023-12-23 ENCOUNTER — Other Ambulatory Visit: Payer: Self-pay

## 2023-12-23 DIAGNOSIS — K580 Irritable bowel syndrome with diarrhea: Secondary | ICD-10-CM

## 2023-12-24 MED ORDER — VIBERZI 75 MG PO TABS: 75.0000 mg | ORAL_TABLET | Freq: Every day | ORAL | 0 refills | Status: DC

## 2024-02-02 NOTE — Telephone Encounter (Unsigned)
 Copied from CRM [id number]. Topic: Clinical - Medication Question >> Feb 02, 2024  9:12 AM Albertha Alosa wrote: Reason for CRM: Patient dad called in regarding prescription Eluxadoline  (VIBERZI ) 75 MG TABS , informed him per message that [facility] sent in prescription of 90 day supply to the pharmacy , stated he doesn't know if they still have it. Informed patient to call pharmacy

## 2024-04-13 ENCOUNTER — Encounter: Payer: Self-pay | Admitting: Internal Medicine

## 2024-04-13 ENCOUNTER — Ambulatory Visit (INDEPENDENT_AMBULATORY_CARE_PROVIDER_SITE_OTHER): Admitting: Internal Medicine

## 2024-04-13 VITALS — BP 120/80 | HR 88 | Temp 98.6°F | Ht 69.75 in | Wt 231.0 lb

## 2024-04-13 DIAGNOSIS — R079 Chest pain, unspecified: Secondary | ICD-10-CM | POA: Insufficient documentation

## 2024-04-13 DIAGNOSIS — F411 Generalized anxiety disorder: Secondary | ICD-10-CM

## 2024-04-13 DIAGNOSIS — R0789 Other chest pain: Secondary | ICD-10-CM | POA: Insufficient documentation

## 2024-04-13 DIAGNOSIS — K219 Gastro-esophageal reflux disease without esophagitis: Secondary | ICD-10-CM

## 2024-04-13 MED ORDER — OMEPRAZOLE 40 MG PO CPDR: 40.0000 mg | DELAYED_RELEASE_CAPSULE | Freq: Every day | ORAL | 3 refills | Status: DC

## 2024-04-13 NOTE — Progress Notes (Signed)
   Subjective:    Patient ID: William Singleton, male    DOB: [birth year], 28 y.o.   MRN: [id number]  HPI Here due to chest pain With dad  Having trouble chronically with acid reflux Recurrent chest pain for a year or more Also has SOB and pain with anxiety Stopped sertraline  and buspirone  about a month ago--anxiety seems better. No anxiety attacks Still gets the chest pain Doesn't notice it with activity Has increased omeprazole  to 40mg  daily--in AM. 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 Will have beer once in a while  On viberzi  for years--for IBS (from GI doctor)  Large weight fluctuations  Current Outpatient Medications on File Prior to Visit  Medication Sig Dispense Refill   Eluxadoline  (VIBERZI ) 75 MG TABS Take 1 tablet (75 mg total) by mouth daily. For IBS. 90 tablet 0   hydrOXYzine  (VISTARIL ) 25 MG capsule Take 1 capsule (25 mg total) by mouth 2 (two) times daily as needed for anxiety. 180 capsule 2   omeprazole  (PRILOSEC) 20 MG capsule Take 1 capsule (20 mg total) by mouth daily. for heartburn. (Patient taking differently: Take 40 mg by mouth daily. for heartburn.) 90 capsule 2   No current facility-administered medications on file prior to visit.    Allergies  Allergen Reactions   Penicillins Rash    Past Medical History:  Diagnosis Date   Generalized anxiety disorder    IBS (irritable bowel syndrome)     History reviewed. No pertinent surgical history.  Family History  Problem Relation Age of Onset   Valvular heart disease Paternal Grandfather     Review of Systems No dysphagia--except slightly if anxious Sleeps well    Objective:   Physical Exam Constitutional:      Appearance: Normal appearance.  Cardiovascular:     Rate and Rhythm: Normal rate and regular rhythm.      Pulses: Normal pulses.     Heart sounds: No murmur heard.    No gallop.  Pulmonary:     Effort: Pulmonary effort is normal.     Breath sounds: Normal breath sounds. No wheezing or rales.     Comments: Mild tenderness in lateral left breast---same spot as pain (but doesn't mimic the pain) Abdominal:     Palpations: Abdomen is soft.     Tenderness: There is no abdominal tenderness.  Musculoskeletal:     Cervical back: Neck supple.     Right lower leg: No edema.     Left lower leg: No edema.  Lymphadenopathy:     Cervical: No cervical adenopathy.  Neurological:     Mental Status: He is alert.  Psychiatric:        Mood and Affect: Mood normal.        Behavior: Behavior normal.            Assessment & Plan:

## 2024-04-13 NOTE — Assessment & Plan Note (Signed)
 Will try the higher dose omeprazole 

## 2024-04-13 NOTE — Assessment & Plan Note (Addendum)
 Not suspicious for cardiac etiology--but will check EKG Likely reflux &/or related to anxiety EKG shows sinus at 84. Normal axis and intervals and no ischemia/hypertrophy (normal)  Will Rx omeprazole  40mg  daily (may want to check in with his GI) Has the hydroxyzine  for prn use for anxiety May be better once he is back at work Don't eat close to bedtime

## 2024-04-13 NOTE — Assessment & Plan Note (Signed)
 Seems to be okay without the buspar  or sertraline  Has the hydroxyzine  for prn use

## 2024-06-03 ENCOUNTER — Other Ambulatory Visit: Payer: Self-pay

## 2024-06-03 ENCOUNTER — Ambulatory Visit: Payer: Self-pay

## 2024-06-03 ENCOUNTER — Emergency Department
Admission: EM | Admit: 2024-06-03 | Discharge: 2024-06-03 | Disposition: A | Discharge status: Home / Self Care | Payer: Self-pay | Source: Ambulatory Visit | Attending: Emergency Medicine | Admitting: Emergency Medicine

## 2024-06-03 ENCOUNTER — Emergency Department: Payer: Self-pay

## 2024-06-03 DIAGNOSIS — R079 Chest pain, unspecified: Secondary | ICD-10-CM | POA: Insufficient documentation

## 2024-06-03 DIAGNOSIS — R202 Paresthesia of skin: Secondary | ICD-10-CM | POA: Insufficient documentation

## 2024-06-03 LAB — CBC
HCT: 45 % (ref 39.0–52.0)
Hemoglobin: 15.8 g/dL (ref 13.0–17.0)
MCH: 29 pg (ref 26.0–34.0)
MCHC: 35.1 g/dL (ref 30.0–36.0)
MCV: 82.6 fL (ref 80.0–100.0)
Platelets: 418 K/uL — ABNORMAL HIGH (ref 150–400)
RBC: 5.45 MIL/uL (ref 4.22–5.81)
RDW: 12.2 % (ref 11.5–15.5)
WBC: 8.8 K/uL (ref 4.0–10.5)
nRBC: 0 % (ref 0.0–0.2)

## 2024-06-03 LAB — BASIC METABOLIC PANEL WITH GFR
Anion gap: 12 (ref 5–15)
BUN: 15 mg/dL (ref 6–20)
CO2: 25 mmol/L (ref 22–32)
Calcium: 9.4 mg/dL (ref 8.9–10.3)
Chloride: 101 mmol/L (ref 98–111)
Creatinine, Ser: 1.04 mg/dL (ref 0.61–1.24)
GFR, Estimated: >60 mL/min (ref >60)
Glucose, Bld: 104 mg/dL — ABNORMAL HIGH (ref 70–99)
Potassium: 4.2 mmol/L (ref 3.5–5.1)
Sodium: 138 mmol/L (ref 135–145)

## 2024-06-03 LAB — TROPONIN I (HIGH SENSITIVITY): Troponin I (High Sensitivity): <2 ng/L (ref <18)

## 2024-06-03 MED ORDER — LORAZEPAM 1 MG PO TABS
1.0000 mg | ORAL_TABLET | Freq: Once | ORAL | Status: AC
  Administered 2024-06-03: 1 mg via ORAL
  Filled 2024-06-03: qty 1

## 2024-06-03 MED ORDER — LORAZEPAM 0.5 MG PO TABS: 0.5000 mg | ORAL_TABLET | Freq: Two times a day (BID) | ORAL | 0 refills | Status: DC | PRN

## 2024-06-03 NOTE — Telephone Encounter (Signed)
 Can we move patient to my schedule? I have several openings on the 23rd.

## 2024-06-03 NOTE — ED Triage Notes (Addendum)
 Pt comes from [facility] with c/o cp. Pt states 1 week. Pt states this happened before. Pt states it usually goes away. Pt states left arm pain.

## 2024-06-03 NOTE — Telephone Encounter (Signed)
 Lvm asking pt to call back. Need to see if pt is ok to see Mallie on 06/07/24 vs Dr Avelina, per [facility] message below. If so, plz r/s pt to [facility] schedule.   I am also sending pt a MyChart message.

## 2024-06-03 NOTE — ED Triage Notes (Signed)
 First nurse note: pt to ED from [facility] for intermittent cp x1 week.

## 2024-06-03 NOTE — Telephone Encounter (Signed)
 FYI Only or Action Required?: FYI only for provider.   Was seen in office for this in July; father requests follow up visit; instructed to go to ER if becomes worse.   Patient was last seen in primary care on 04/13/2024 by William Charlie FERNS, MD.  Called Nurse Triage reporting Chest Pain.  Symptoms began several months ago.  Interventions attempted: Nothing.  Symptoms are: unchanged.  Triage Disposition: See PCP When Office is Open (Within 3 Days)  Patient/caregiver understands and will follow disposition?: Yes  Copied from CRM [id number]. Topic: Clinical - Red Word Triage >> Jun 03, 2024  2:51 PM Carlyon D wrote: Red Word that prompted transfer to Nurse Triage: left side chest pain/ Anxiety Reason for Disposition  [1] Chest pain from known angina comes and goes AND [2] is NOT happening more often (increasing in frequency) or getting worse (increasing in severity)  Answer Assessment - Initial Assessment Questions 1. LOCATION: Where does it hurt?       Left sided chest pain and has had this prior.  Was last seen on 04/13/24 by Dr. [name]. 2. RADIATION: Does the pain go anywhere else? (e.g., into neck, jaw, arms, back)     unknown 3. ONSET: When did the chest pain begin? (Minutes, hours or days)      A year ago 4. PATTERN: Does the pain come and go, or has it been constant since it started?  Does it get worse with exertion?      Constant but does come and go 5. DURATION: How long does it last (e.g., seconds, minutes, hours)     Hx anxiety, dad states it comes and goes 6. SEVERITY: How bad is the pain?  (e.g., Scale 1-10; mild, moderate, or severe)     sharp 7. CARDIAC RISK FACTORS: Do you have any history of heart problems or risk factors for heart disease? (e.g., angina, prior heart attack; diabetes, high blood pressure, high cholesterol, smoker, or strong family history of heart disease)     denies 8. PULMONARY RISK FACTORS: Do you have any history of lung disease?   (e.g., blood clots in lung, asthma, emphysema, birth control pills)     denies 9. CAUSE: What do you think is causing the chest pain?     unknown 10. OTHER SYMPTOMS: Do you have any other symptoms? (e.g., dizziness, nausea, vomiting, sweating, fever, difficulty breathing, cough)       Hx anxiety, denies above 11. PREGNANCY: Is there any chance you are pregnant? When was your last menstrual period?       na  Protocols used: Chest Pain-A-AH

## 2024-06-03 NOTE — Discharge Instructions (Addendum)
 Your exam, labs, EKG, and chest x-ray are normal and reassuring at this time.  No signs of a serious cardiac or pulmonary source of your chest pain.  You should follow-up with your primary provider as discussed.  You should also consider referrals to GI medicine and cardiology for ongoing evaluation.

## 2024-06-05 NOTE — ED Provider Notes (Signed)
 [facility] Emergency Department Provider Note     Event Date/Time   First MD Initiated Contact with Patient 06/03/24 1947     (approximate)   History   Chest Pain   HPI  William Singleton is a 28 y.o. male with a history of generalized anxiety, IBS, and GERD, presents to the ED from [facility].  Patient with endorsed 1 week of intermittent chest pain and discomfort.  He reports previous episodes of the same in the past with normal and reassuring workups.  He reports symptoms usually resolve without intervention after a few hours.  He reports he developed some intermittent left arm tingling while driving, which prompted him to report to the ED.  He also endorses that his anxiety could have added to his symptomology.  He also reports some alcohol use disorder, and has not had a alcoholic drink in about a week.  He denies any nausea, vomiting, or seizure activity.  Patient's father is at bedside for support.  Physical Exam   Triage Vital Signs: ED Triage Vitals  Encounter Vitals Group     BP 06/03/24 1749 (!) 169/108     Girls Systolic BP Percentile --      Girls Diastolic BP Percentile --      Boys Systolic BP Percentile --      Boys Diastolic BP Percentile --      Pulse Rate 06/03/24 1749 79     Resp 06/03/24 1749 18     Temp 06/03/24 1749 98 F (36.7 C)     Temp src --      SpO2 06/03/24 1749 94 %     Weight 06/03/24 1750 230 lb (104.3 kg)     Height 06/03/24 1750 5' 11 (1.803 m)     Head Circumference --      Peak Flow --      Pain Score 06/03/24 1749 5     Pain Loc --      Pain Education --      Exclude from Growth Chart --     Most recent vital signs: Vitals:   06/03/24 1749 06/03/24 2030  BP: (!) 169/108 (!) 144/91  Pulse: 79 75  Resp: 18 18  Temp: 98 F (36.7 C) 98.1 F (36.7 C)  SpO2: 94% 97%    General Awake, no distress. NAD HEENT NCAT. PERRL. EOMI. No rhinorrhea. Mucous membranes are moist.  CV:  Good peripheral perfusion.  RRR RESP:  Normal effort. CTA ABD:  No distention.  NEURO: Cranial nerves II to XII grossly intact.   ED Results / Procedures / Treatments   Labs (all labs ordered are listed, but only abnormal results are displayed) Labs Reviewed  BASIC METABOLIC PANEL WITH GFR - Abnormal; Notable for the following components:      Result Value   Glucose, Bld 104 (*)    All other components within normal limits  CBC - Abnormal; Notable for the following components:   Platelets 418 (*)    All other components within normal limits  TROPONIN I (HIGH SENSITIVITY)    EKG  Vent. rate 85 BPM PR interval 172 ms QRS duration 90 ms QT/QTcB 364/433 ms P-R-T axes 34 90 41 Normal sinus rhythm Rightward axis No STEMI  RADIOLOGY  I personally viewed and evaluated these images as part of my medical decision making, as well as reviewing the written report by the radiologist.  ED Provider Interpretation: No acute finding  CXR  IMPRESSION: No  acute cardiopulmonary abnormality.    PROCEDURES:  Critical Care performed: No  Procedures   MEDICATIONS ORDERED IN ED: Medications  LORazepam  (ATIVAN ) tablet 1 mg (1 mg Oral Given 06/03/24 2045)     IMPRESSION / MDM / ASSESSMENT AND PLAN / ED COURSE  I reviewed the triage vital signs and the nursing notes.                              Differential diagnosis includes, but is not limited to, ACS, aortic dissection, pulmonary embolism, cardiac tamponade, pneumothorax, pneumonia, pericarditis, myocarditis, GI-related causes including esophagitis/gastritis, and musculoskeletal chest wall pain.    Patient's presentation is most consistent with acute complicated illness / injury requiring diagnostic workup.  Patient's diagnosis is consistent with nonspecific chest pain.  Patient presents in no acute distress, endorsing 1 week of intermittent chest pain complaints.  Patient was also advised that in the same week he has attempted to abstain from any  alcohol intake, after noting some alcohol use disorder and regular alcohol intake.  He denies any nausea, vomiting, dizziness.  No headache, weakness, or syncope.  No seizure activity reported.  His exam is overall reassuring.  No lab maladies are noted.  Troponin is normal at this time.  No EKG evidence of any malignant arrhythmia.  Chest x-ray interpreted by me, shows no acute intrathoracic process.  Patient without any evidence of acute alcohol withdrawal or DTs on presentation.  Patient will be discharged home with prescriptions for Ativan  0.5 mg. Patient is to follow up with his PCP or GI provider as discussed, as needed or otherwise directed. Patient is given ED precautions to return to the ED for any worsening or new symptoms.  FINAL CLINICAL IMPRESSION(S) / ED DIAGNOSES   Final diagnoses:  Nonspecific chest pain     Rx / DC Orders   ED Discharge Orders          Ordered    LORazepam  (ATIVAN ) 0.5 MG tablet  Every 12 hours PRN        06/03/24 2043             Note:  This document was prepared using Dragon voice recognition software and may include unintentional dictation errors.    Loyd Candida LULLA Aldona, PA-C 06/05/24 0009    Ernest Ronal BRAVO, MD 06/06/24 [phone]

## 2024-06-06 NOTE — Telephone Encounter (Signed)
 Lvm asking pt to call back. Need to see if pt is ok to see Mallie on 06/07/24 vs Dr Avelina, per [facility] message below. If so, plz r/s pt to [facility] schedule.

## 2024-06-07 ENCOUNTER — Ambulatory Visit (INDEPENDENT_AMBULATORY_CARE_PROVIDER_SITE_OTHER): Admitting: Family Medicine

## 2024-06-07 ENCOUNTER — Ambulatory Visit: Payer: Self-pay | Admitting: Family Medicine

## 2024-06-07 ENCOUNTER — Encounter: Payer: Self-pay | Admitting: Family Medicine

## 2024-06-07 VITALS — BP 110/80 | HR 82 | Temp 98.0°F | Ht 69.75 in | Wt 230.0 lb

## 2024-06-07 DIAGNOSIS — Z6833 Body mass index (BMI) 33.0-33.9, adult: Secondary | ICD-10-CM | POA: Insufficient documentation

## 2024-06-07 DIAGNOSIS — K219 Gastro-esophageal reflux disease without esophagitis: Secondary | ICD-10-CM | POA: Diagnosis not present

## 2024-06-07 DIAGNOSIS — R0789 Other chest pain: Secondary | ICD-10-CM

## 2024-06-07 DIAGNOSIS — F411 Generalized anxiety disorder: Secondary | ICD-10-CM | POA: Diagnosis not present

## 2024-06-07 DIAGNOSIS — Z79899 Other long term (current) drug therapy: Secondary | ICD-10-CM

## 2024-06-07 LAB — LIPID PANEL
Cholesterol: 128 mg/dL (ref 0–200)
HDL: 38.8 mg/dL — ABNORMAL LOW (ref >39.00)
LDL Cholesterol: 79 mg/dL (ref 0–99)
NonHDL: 88.89
Total CHOL/HDL Ratio: 3
Triglycerides: 51 mg/dL (ref 0.0–149.0)
VLDL: 10.2 mg/dL (ref 0.0–40.0)

## 2024-06-07 LAB — HEPATIC FUNCTION PANEL
ALT: 40 U/L (ref 0–53)
AST: 22 U/L (ref 0–37)
Albumin: 4.6 g/dL (ref 3.5–5.2)
Alkaline Phosphatase: 69 U/L (ref 39–117)
Bilirubin, Direct: 0.2 mg/dL (ref 0.0–0.3)
Total Bilirubin: 0.7 mg/dL (ref 0.2–1.2)
Total Protein: 7 g/dL (ref 6.0–8.3)

## 2024-06-07 LAB — HEMOGLOBIN A1C: Hgb A1c MFr Bld: 5.2 % (ref 4.6–6.5)

## 2024-06-07 MED ORDER — PANTOPRAZOLE SODIUM 40 MG PO TBEC: 40.0000 mg | DELAYED_RELEASE_TABLET | Freq: Every day | ORAL | 3 refills | Status: DC

## 2024-06-07 MED ORDER — LORAZEPAM 0.5 MG PO TABS: 0.5000 mg | ORAL_TABLET | Freq: Every day | ORAL | 0 refills | Status: AC | PRN

## 2024-06-07 NOTE — Assessment & Plan Note (Addendum)
 Acute, not clearly cardiac.  No exertional component. Likely secondary to GERD versus anxiety versus chest wall pain. Discussed possible cardiology referral.  Will evaluate with risk factors by checking lipid and A1c.  No first-degree family history.  Nonsmoker.

## 2024-06-07 NOTE — Telephone Encounter (Signed)
Noted. Thanks for trying.

## 2024-06-07 NOTE — Progress Notes (Signed)
 Patient ID: William Singleton, male    DOB: [birth year], 28 y.o.   MRN: [id number]  This visit was conducted in person.  BP 110/80   Pulse 82   Temp 98 F (36.7 C) (Temporal)   Ht 5' 9.75 (1.772 m)   Wt 230 lb (104.3 kg)   SpO2 98%   BMI 33.24 kg/m    CC:  Chief Complaint  Patient presents with   Chest Pain    ER follow up from 06/03/2024    Subjective:   HPI: William Singleton is a 28 y.o. male patient of Mallie Gaskins, NP with history of GAD, IBS, GERD presenting on 06/07/2024 for Chest Pain (ER follow up from 06/03/2024)  Patient seen for chest pain April 13, 2024 as well. EKG showed sinus rhythm heart rate 84, unremarkable. At that time prescribed omeprazole  40 mg daily, as well as trigger avoidance and lifestyle changes. Has had hydroxyzine  for use as needed for anxiety.   He was also seen at ED on 06/03/2024 for atypical chest pain associated with left arm swelling, numbness, tingling   Labs reassuring, troponin nml. CXR and EKG unremarkable.  Given rx for lorazepam  to use prn... thius  Today he reports he has been having increase anxiety in last few weeks.   He continues to have heartburn, ... still eating spicy foods and alcohol ( has not had any in last week)   Has not seen GI in past for GERD.. saw long time ago for IBS, not sure.   Family history of CAD in grandparents.    He was off  sertraline  100 mg daily, stopped because he thought it could be  causing SE.... he has restarted in last months. Hydroxyzine  not as helpful.   Relevant past medical, surgical, family and social history reviewed and updated as indicated. Interim medical history since our last visit reviewed. Allergies and medications reviewed and updated. Outpatient Medications Prior to Visit  Medication Sig Dispense Refill   Eluxadoline  (VIBERZI ) 75 MG TABS Take 1 tablet (75 mg total) by mouth daily. For IBS. 90 tablet 0   hydrOXYzine  (VISTARIL ) 25 MG capsule Take 1 capsule (25 mg total)  by mouth 2 (two) times daily as needed for anxiety. 180 capsule 2   omeprazole  (PRILOSEC) 40 MG capsule Take 1 capsule (40 mg total) by mouth daily. 30 capsule 3   sertraline  (ZOLOFT ) 100 MG tablet Take 100 mg by mouth daily.     LORazepam  (ATIVAN ) 0.5 MG tablet Take 1 tablet (0.5 mg total) by mouth every 12 (twelve) hours as needed for up to 8 doses for anxiety. 8 tablet 0   No facility-administered medications prior to visit.     Per HPI unless specifically indicated in ROS section below Review of Systems  Constitutional:  Negative for fatigue and fever.  HENT:  Negative for ear pain.   Eyes:  Negative for pain.  Respiratory:  Negative for cough and shortness of breath.   Cardiovascular:  Positive for chest pain. Negative for palpitations and leg swelling.  Gastrointestinal:  Negative for abdominal pain.  Genitourinary:  Negative for dysuria.  Musculoskeletal:  Negative for arthralgias.  Neurological:  Negative for syncope, light-headedness and headaches.  Psychiatric/Behavioral:  Negative for dysphoric mood. The patient is nervous/anxious.    Objective:  BP 110/80   Pulse 82   Temp 98 F (36.7 C) (Temporal)   Ht 5' 9.75 (1.772 m)   Wt 230 lb (104.3 kg)   SpO2 98%  BMI 33.24 kg/m   Wt Readings from Last 3 Encounters:  06/07/24 230 lb (104.3 kg)  06/03/24 230 lb (104.3 kg)  04/13/24 231 lb (104.8 kg)      Physical Exam Vitals reviewed.  Constitutional:      Appearance: He is well-developed. He is obese.  HENT:     Head: Normocephalic.     Right Ear: Hearing normal.     Left Ear: Hearing normal.     Nose: Nose normal.  Neck:     Thyroid: No thyroid mass or thyromegaly.     Vascular: No carotid bruit.     Trachea: Trachea normal.  Cardiovascular:     Rate and Rhythm: Normal rate and regular rhythm.     Pulses: Normal pulses.     Heart sounds: Heart sounds not distant. No murmur heard.    No friction rub. No gallop.     Comments: No peripheral edema Pulmonary:      Effort: Pulmonary effort is normal. No respiratory distress.     Breath sounds: Normal breath sounds.  Chest:     Chest wall: Tenderness present. No mass, deformity, swelling, crepitus or edema.       Comments: Area of tenderness to palpation Skin:    General: Skin is warm and dry.     Findings: No rash.  Psychiatric:        Speech: Speech normal.        Behavior: Behavior normal.        Thought Content: Thought content normal.       Results for orders placed or performed during the hospital encounter of 06/03/24  Basic metabolic panel   Collection Time: 06/03/24  5:49 PM  Result Value Ref Range   Sodium 138 135 - 145 mmol/L   Potassium 4.2 3.5 - 5.1 mmol/L   Chloride 101 98 - 111 mmol/L   CO2 25 22 - 32 mmol/L   Glucose, Bld 104 (H) 70 - 99 mg/dL   BUN 15 6 - 20 mg/dL   Creatinine, Ser 8.95 0.61 - 1.24 mg/dL   Calcium 9.4 8.9 - 89.6 mg/dL   GFR, Estimated >39 >39 mL/min   Anion gap 12 5 - 15  CBC   Collection Time: 06/03/24  5:49 PM  Result Value Ref Range   WBC 8.8 4.0 - 10.5 K/uL   RBC 5.45 4.22 - 5.81 MIL/uL   Hemoglobin 15.8 13.0 - 17.0 g/dL   HCT 54.9 60.9 - 47.9 %   MCV 82.6 80.0 - 100.0 fL   MCH 29.0 26.0 - 34.0 pg   MCHC 35.1 30.0 - 36.0 g/dL   RDW 87.7 88.4 - 84.4 %   Platelets 418 (H) 150 - 400 K/uL   nRBC 0.0 0.0 - 0.2 %  Troponin I (High Sensitivity)   Collection Time: 06/03/24  5:49 PM  Result Value Ref Range   Troponin I (High Sensitivity) <2 <18 ng/L    Assessment and Plan  Atypical chest pain Assessment & Plan: Acute, not clearly cardiac.  No exertional component. Likely secondary to GERD versus anxiety versus chest wall pain. Discussed possible cardiology referral.  Will evaluate with risk factors by checking lipid and A1c.  No first-degree family history. Patient does have risk factors of inactivity and obesity.  Orders: -     Lipid panel -     Hemoglobin A1c  Gastroesophageal reflux disease, unspecified whether esophagitis  present Assessment & Plan: Acute, inadequate control. Counseled patient on trigger avoidance, alcohol  avoidance, weight management. Will change omeprazole  to pantoprazole  40 mg daily. Will move forward with referral to GI for possible endoscopy.  Orders: -     Ambulatory referral to Gastroenterology  GAD (generalized anxiety disorder) Assessment & Plan: Chronic, patient has restarted sertraline  100 mg p.o. daily.  This will likely take a month before it starts working and worked well for the patient in the past for his anxiety.  I have provided a temporary prescription for lorazepam  0.5 mg daily as needed panic attacks/anxiety.  We discussed that this is not a good long-term medication. Offered counseling but patient is not interested at this time.   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  Encouraged patient to work on [employer].  Orders: -     Lipid panel -     Hemoglobin A1c  High risk medication use -     Hepatic function panel  Other orders -     Pantoprazole  Sodium; Take 1 tablet (40 mg total) by mouth daily.  Dispense: 30 tablet; Refill: 3 -     LORazepam ; Take 1 tablet (0.5 mg total) by mouth daily as needed for anxiety.  Dispense: 30 tablet; Refill: 0    Return in about 4 weeks (around 07/05/2024) for follow up mood.   Greig Ring, MD

## 2024-06-07 NOTE — Assessment & Plan Note (Signed)
 Chronic, patient has restarted sertraline  100 mg p.o. daily.  This will likely take a month before it starts working and worked well for the patient in the past for his anxiety.  I have provided a temporary prescription for lorazepam  0.5 mg daily as needed panic attacks/anxiety.  We discussed that this is not a good long-term medication. Offered counseling but patient is not interested at this time.

## 2024-06-07 NOTE — Assessment & Plan Note (Signed)
 Acute, inadequate control. Counseled patient on trigger avoidance, alcohol avoidance, weight management. Will change omeprazole  to pantoprazole  40 mg daily. Will move forward with referral to GI for possible endoscopy.

## 2024-06-07 NOTE — Assessment & Plan Note (Signed)
 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  Encouraged patient to work on [employer].

## 2024-06-07 NOTE — Telephone Encounter (Signed)
 Patient never returned phone call, Patient saw Dr. Avelina this morning.

## 2024-06-28 DIAGNOSIS — K580 Irritable bowel syndrome with diarrhea: Secondary | ICD-10-CM

## 2024-06-28 MED ORDER — VIBERZI 75 MG PO TABS: 75.0000 mg | ORAL_TABLET | Freq: Every day | ORAL | 0 refills | Status: DC

## 2024-06-29 ENCOUNTER — Telehealth: Payer: Self-pay

## 2024-06-29 ENCOUNTER — Other Ambulatory Visit (HOSPITAL_COMMUNITY): Payer: Self-pay

## 2024-06-29 NOTE — Telephone Encounter (Signed)
 Pharmacy Patient Advocate Encounter  Received notification from [facility] that Prior Authorization for Viberzi  75 has been APPROVED from 06/29/24 to 06/29/27. Ran test claim, Copay is $375.84 for 90 day supply. This test claim was processed through [facility]- copay amounts may vary at other pharmacies due to pharmacy/plan contracts, or as the patient moves through the different stages of their insurance plan.   PA #/Case ID/Reference #: # [id number]

## 2024-06-29 NOTE — Telephone Encounter (Signed)
 Pharmacy Patient Advocate Encounter   Received notification from Onbase that prior authorization for Viberzi  75 is required/requested.   Insurance verification completed.   The patient is insured through [facility].   Per test claim: PA required; PA submitted to above mentioned insurance via Latent Key/confirmation #/EOC [id number] Status is pending

## 2024-07-04 ENCOUNTER — Encounter: Payer: Self-pay | Admitting: Family Medicine

## 2024-07-05 NOTE — Progress Notes (Unsigned)
 Cardiology Office Note  Date:  07/08/2024   ID:  William Singleton, DOB [birth year], MRN [id number]  PCP:  Gretta Comer POUR, NP   Chief Complaint  Patient presents with   New Patient (Initial Visit)    Patient was at [facility] ER with chest pain on 06/03/2024. Patient c/o chest pain, racing heartbeats and shortness of breath.     HPI:  William Singleton is a 28 y.o. 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, worked in a bar Problems with alcohol, anxiety Moved back home, has stopped drinking Continued problems with anxiety, used to self medicate with alcohol  As started going to the gym, lifting weights Developed some discomfort left pectoral area sometimes into his left scapular area, reproducible on palpation  Seen in the emergency room June 03, 2024 chest pain Prior episodes of chest pain that typically resolved without intervention after few hours Received alprazolam in the ER, Cardiac workup negative  Continues to have occasional chest pain left pectoral area at rest, point specific, able to reproduce Symptoms can sometimes make him anxious  No significant chest pain concerning for angina on exertion EKG personally reviewed by myself on todays visit EKG Interpretation Date/Time:  Friday July 08 2024 09:55:44 EDT Ventricular Rate:  84 PR Interval:  170 QRS Duration:  94 QT Interval:  382 QTC Calculation: 451 R Axis:   25  Text Interpretation: Normal sinus rhythm When compared with ECG of 03-Jun-2024 17:51, No significant change was found Confirmed by Perla Lye [phone]) on 07/08/2024 10:03:23 AM     PMH:   has a past medical history of Generalized anxiety disorder and IBS (irritable bowel syndrome).   PSH:    History reviewed. No pertinent surgical history.  Current Outpatient Medications  Medication Sig Dispense Refill   Eluxadoline  (VIBERZI ) 75 MG TABS Take 1 tablet (75 mg total) by mouth daily. For IBS. 90 tablet 0   hydrOXYzine  (VISTARIL ) 25 MG capsule Take 1 capsule (25 mg total) by mouth 2 (two) times daily as needed for anxiety. 180 capsule 2   LORazepam  (ATIVAN ) 0.5 MG tablet Take 1 tablet (0.5 mg total) by mouth daily as needed for anxiety. 30 tablet 0   pantoprazole  (PROTONIX ) 40 MG tablet Take 1 tablet (40 mg total) by mouth daily. 30 tablet 3   sertraline  (ZOLOFT ) 100 MG tablet Take 100 mg by mouth daily.     No current facility-administered medications for this visit.    Allergies:   Penicillins   Social History:  The patient  reports that he has never smoked. He has never used smokeless tobacco. He reports that he does not drink alcohol and does not use drugs.   Family History:   family history includes Atrial fibrillation in his paternal grandfather; Diabetes in his maternal grandfather and maternal grandmother; Hyperlipidemia in his maternal grandfather, maternal grandmother, and another family member; Hypertension in his maternal grandfather, maternal grandmother, and another family member; Valvular heart disease in his paternal grandfather.    Review of Systems: Review of Systems  Constitutional: Negative.   HENT: Negative.    Respiratory: Negative.    Cardiovascular: Negative.   Gastrointestinal: Negative.   Musculoskeletal: Negative.   Neurological: Negative.   Psychiatric/Behavioral: Negative.    All other  systems reviewed and are negative.   PHYSICAL EXAM: VS:  BP (!) 130/100 (BP Location: Right Arm, Patient Position: Sitting, Cuff Size: Normal)   Pulse 84   Ht 5' 11 (1.803 m)   Wt 240 lb 2 oz (108.9 kg)   SpO2 97%   BMI 33.49 kg/m  , BMI Body mass index is 33.49 kg/m. GEN: Well nourished, well developed, in no acute distress HEENT: normal Neck: no JVD,  carotid bruits, or masses Cardiac: RRR; no murmurs, rubs, or gallops,no edema  Respiratory:  clear to auscultation bilaterally, normal work of breathing GI: soft, nontender, nondistended, + BS MS: no deformity or atrophy Skin: warm and dry, no rash Neuro:  Strength and sensation are intact Psych: euthymic mood, full affect   Recent Labs: 06/03/2024: BUN 15; Creatinine, Ser 1.04; Hemoglobin 15.8; Platelets 418; Potassium 4.2; Sodium 138 06/07/2024: ALT 40    Lipid Panel Lab Results  Component Value Date   CHOL 128 06/07/2024   HDL 38.80 (L) 06/07/2024   LDLCALC 79 06/07/2024   TRIG 51.0 06/07/2024     Wt Readings from Last 3 Encounters:  07/08/24 240 lb 2 oz (108.9 kg)  06/07/24 230 lb (104.3 kg)  06/03/24 230 lb (104.3 kg)      ASSESSMENT AND PLAN:  Problem List Items Addressed This Visit     GAD (generalized anxiety disorder)   Atypical chest pain - Primary   Relevant Orders   EKG 12-Lead (Completed)   IBS (irritable bowel syndrome)    Chest pain Atypical in nature, likely chest wall disorder Localized left pectoral pain, also radiating to left scapular area Possibly exacerbated by recent start of lifting weights Also sleeps in awkward position with left arm stretched out Recommend NSAIDs, icing for recurrent symptoms, avoid heavy lifting for now Reassurance provided, No cardiac risk factors, cholesterol well-controlled on no medication, nondiabetic, no cigarette smoking, no strong family history of cardiac disease -No further cardiac testing needed  GAD Followed by primary care, parents trying to get him into therapy Currently on Zoloft , received several Ativan  from the emergency room for panic attacks  History of alcohol Cessation recommended   Seen in consultation for Comer Gaskins and will be referred back to her office for ongoing care of the issues detailed above   Signed, Velinda Lunger, M.D., Ph.D. [facility] [place],  [place] [phone]

## 2024-07-08 ENCOUNTER — Encounter: Payer: Self-pay | Admitting: Cardiovascular Disease

## 2024-07-08 ENCOUNTER — Ambulatory Visit: Attending: Cardiovascular Disease | Admitting: Cardiovascular Disease

## 2024-07-08 VITALS — BP 130/100 | HR 84 | Ht 71.0 in | Wt 240.1 lb

## 2024-07-08 DIAGNOSIS — F411 Generalized anxiety disorder: Secondary | ICD-10-CM

## 2024-07-08 DIAGNOSIS — R0789 Other chest pain: Secondary | ICD-10-CM

## 2024-07-08 DIAGNOSIS — K580 Irritable bowel syndrome with diarrhea: Secondary | ICD-10-CM | POA: Diagnosis not present

## 2024-07-08 NOTE — Patient Instructions (Signed)

## 2024-07-13 ENCOUNTER — Encounter: Payer: Self-pay | Admitting: Primary Care

## 2024-08-05 ENCOUNTER — Encounter: Payer: Self-pay | Admitting: Family Medicine

## 2024-08-05 ENCOUNTER — Ambulatory Visit: Payer: Self-pay

## 2024-08-05 ENCOUNTER — Ambulatory Visit (INDEPENDENT_AMBULATORY_CARE_PROVIDER_SITE_OTHER): Admitting: Family Medicine

## 2024-08-05 VITALS — BP 120/81 | HR 102 | Temp 98.6°F | Ht 71.0 in | Wt 236.9 lb

## 2024-08-05 DIAGNOSIS — L0501 Pilonidal cyst with abscess: Secondary | ICD-10-CM

## 2024-08-05 NOTE — Telephone Encounter (Signed)
 Next Appt With Family Medicine [facility] N PARDUE, DO) 08/05/2024 at 10:00 AM

## 2024-08-05 NOTE — Progress Notes (Signed)
 Established patient visit   Patient: William Singleton   DOB: Dec 09, 1995   28 y.o. Male  MRN: [id number] Visit Date: 08/05/2024  Today's healthcare provider: LAURAINE LOISE BUOY, DO   Chief Complaint  Patient presents with   Acute Visit    Patient is here today for an abscess cyst on his glutes states it is in the middle.  Red, painful, hurts when he walks and sits.  States that it has been there since about Monday, noticed it Tuesday afternoon.  Yesterday and today it has been more painful as to why he came in.   Subjective    HPI William Singleton is a 28 year old male who presents with a painful pilonidal cyst.  He has a pilonidal cyst located at the top of his gluteal cleft. This is not his first occurrence; he experienced a similar issue approximately six to seven years ago, which resolved after it drained spontaneously. He recalls pressing on it, which may have facilitated the drainage. He has not undergone any surgical intervention for this condition in the past.  Currently, the cyst is causing significant discomfort, making it painful for him to walk and sit. He has attempted self-care measures such as Epsom salt baths and applying hydrogen peroxide to the area. He has not noticed a head on the cyst this time, and it has not drained on its own.  His mother, a publishing rights manager, may have provided him with antibiotics in the past, but he does not recall the specifics. He occasionally experiences smaller cysts that cause minor discomfort, but they have never been as large as the current one.  He recently moved from [place] to take a new job, which is set to start next week. He has a known allergy to penicillin.      Medications: Outpatient Medications Prior to Visit  Medication Sig   Eluxadoline  (VIBERZI ) 75 MG TABS Take 1 tablet (75 mg total) by mouth daily. For IBS.   hydrOXYzine  (VISTARIL ) 25 MG capsule Take 1 capsule (25 mg total) by mouth 2 (two) times daily as  needed for anxiety.   LORazepam  (ATIVAN ) 0.5 MG tablet Take 1 tablet (0.5 mg total) by mouth daily as needed for anxiety.   pantoprazole  (PROTONIX ) 40 MG tablet Take 1 tablet (40 mg total) by mouth daily.   sertraline  (ZOLOFT ) 100 MG tablet Take 100 mg by mouth daily.   No facility-administered medications prior to visit.        Objective    BP 120/81 (BP Location: Right Arm, Patient Position: Sitting, Cuff Size: Normal)   Pulse (!) 102   Temp 98.6 F (37 C) (Oral)   Ht 5' 11 (1.803 m)   Wt 236 lb 14.4 oz (107.5 kg)   SpO2 98%   BMI 33.04 kg/m     Physical Exam Vitals and nursing note reviewed.  Constitutional:      General: He is not in acute distress.    Appearance: Normal appearance.  HENT:     Head: Normocephalic and atraumatic.  Eyes:     General: No scleral icterus.    Conjunctiva/sclera: Conjunctivae normal.  Cardiovascular:     Rate and Rhythm: Normal rate.  Pulmonary:     Effort: Pulmonary effort is normal.  Musculoskeletal:       Back:     Comments: Red fluctuant lesion (approx, 1 cm x 2.5 cm) above and extended down into mid-gluteal cleft, with induration on the lateral aspects.  Neurological:     Mental Status: He is alert and oriented to person, place, and time. Mental status is at baseline.  Psychiatric:        Mood and Affect: Mood normal.        Behavior: Behavior normal.      No results found for any visits on 08/05/24.  Assessment & Plan    Pilonidal cyst with abscess   Recurrent pilonidal cyst with large, painful abscess affecting ambulation and sitting. Discussed risks and benefits of incision and drainage versus surgical intervention. Considered his recent relocation and job start in decision-making; patient requesting incision and drainage today.  Discussed risks and benefits of procedure. Counseled on potential need for surgical intervention despite incsion and drainage. Patient expressed understanding. - Due to parents concerns,  patient will be seeking care at [facility] urgent care    No follow-ups on file.      I discussed the assessment and treatment plan with the patient  The patient was provided an opportunity to ask questions and all were answered. The patient agreed with the plan and demonstrated an understanding of the instructions.   The patient was advised to call back or seek an in-person evaluation if the symptoms worsen or if the condition fails to improve as anticipated.    LAURAINE LOISE BUOY, DO  [facility] [phone] (phone) [phone] (fax)  [facility]

## 2024-08-05 NOTE — Telephone Encounter (Signed)
 FYI Only or Action Required?: FYI only for provider: appointment scheduled on 08/05/24.  Patient was last seen in primary care on 06/07/2024 by Avelina Greig BRAVO, MD.  Called Nurse Triage reporting Abscess.  Symptoms began several days ago.  Interventions attempted: OTC medications: Advil.  Symptoms are: cyst/abscess to gluteal cleft with redness and moderate to severe pain gradually worsening.  Triage Disposition: See HCP Within 4 Hours (Or PCP Triage)  Patient/caregiver understands and will follow disposition?: Yes            Copied from CRM #[id number]. Topic: Clinical - Red Word Triage >> Aug 05, 2024  8:32 AM Adelita E wrote: [facility] that prompted transfer to Nurse Triage: Patient's dad, Gordy, called in stating that the patient has a cyst right above his butt crack that is very painful, been going on for a couple days. Reason for Disposition  SEVERE pain (e.g., excruciating)  Answer Assessment - Initial Assessment Questions 1. APPEARANCE of BOIL: What does the boil look like?      Pretty angry looking, narrow and long. Red. Denies any pus or white head center.  2. LOCATION: Where is the boil located?      Gluteal cleft.  3. NUMBER: How many boils are there?      1.  4. SIZE: How big is the boil? (e.g., inches, cm; compare to size of a coin or other object)     Couple of inches.  5. ONSET: When did the boil start?     Couple days.  6. PAIN: Is there any pain? If Yes, ask: How bad is the pain?   (Scale 1-10; or mild, moderate, severe)     Moderate to severe. Taking Advil and helps with pain.  7. FEVER: Do you have a fever? If Yes, ask: What is it, how was it measured, and when did it start?      No.  8. SOURCE: Have you been around anyone with boils or other Staph infections? Have you ever had boils before?     Patient has boil about 5 years ago in the same area.  9. OTHER SYMPTOMS: Do you have any other symptoms? (e.g., shaking  chills, weakness, rash elsewhere on body)     Denies.  Protocols used: Boil (Skin Abscess)-A-AH

## 2024-08-05 NOTE — Telephone Encounter (Signed)
 noted

## 2024-08-10 ENCOUNTER — Ambulatory Visit: Payer: Self-pay | Admitting: Psychiatry

## 2024-09-01 ENCOUNTER — Other Ambulatory Visit: Payer: Self-pay | Admitting: Family Medicine

## 2024-09-01 DIAGNOSIS — K219 Gastro-esophageal reflux disease without esophagitis: Secondary | ICD-10-CM

## 2024-09-01 NOTE — Telephone Encounter (Signed)
 Patient is due for CPE/follow up in January 2026, this will be required prior to any further refills.  Please schedule, thank you!

## 2024-09-02 NOTE — Telephone Encounter (Signed)
 lvm for pt to call office to schedule appt.

## 2024-09-04 DIAGNOSIS — K219 Gastro-esophageal reflux disease without esophagitis: Secondary | ICD-10-CM

## 2024-09-16 ENCOUNTER — Ambulatory Visit: Admitting: Primary Care

## 2024-09-23 ENCOUNTER — Ambulatory Visit: Admitting: Primary Care

## 2024-10-05 ENCOUNTER — Encounter: Admitting: Primary Care

## 2024-10-07 DIAGNOSIS — K580 Irritable bowel syndrome with diarrhea: Secondary | ICD-10-CM

## 2024-10-07 DIAGNOSIS — F411 Generalized anxiety disorder: Secondary | ICD-10-CM

## 2024-10-07 MED ORDER — VIBERZI 75 MG PO TABS: 75.0000 mg | ORAL_TABLET | Freq: Every day | ORAL | 0 refills | Status: AC

## 2024-10-07 MED ORDER — SERTRALINE HCL 100 MG PO TABS: 100.0000 mg | ORAL_TABLET | Freq: Every day | ORAL | 0 refills | Status: AC

## 2024-10-18 ENCOUNTER — Ambulatory Visit: Payer: Self-pay | Admitting: Primary Care

## 2024-10-18 ENCOUNTER — Encounter: Payer: Self-pay | Admitting: Primary Care

## 2024-10-18 ENCOUNTER — Ambulatory Visit: Admitting: Primary Care

## 2024-10-18 VITALS — BP 118/84 | HR 77 | Temp 98.2°F | Ht 70.5 in | Wt 232.0 lb

## 2024-10-18 DIAGNOSIS — Z0000 Encounter for general adult medical examination without abnormal findings: Secondary | ICD-10-CM | POA: Diagnosis not present

## 2024-10-18 DIAGNOSIS — F411 Generalized anxiety disorder: Secondary | ICD-10-CM | POA: Diagnosis not present

## 2024-10-18 DIAGNOSIS — Z79899 Other long term (current) drug therapy: Secondary | ICD-10-CM | POA: Diagnosis not present

## 2024-10-18 DIAGNOSIS — K580 Irritable bowel syndrome with diarrhea: Secondary | ICD-10-CM | POA: Diagnosis not present

## 2024-10-18 DIAGNOSIS — K219 Gastro-esophageal reflux disease without esophagitis: Secondary | ICD-10-CM | POA: Diagnosis not present

## 2024-10-18 LAB — LIPID PANEL
Cholesterol: 108 mg/dL (ref 28–200)
HDL: 35.1 mg/dL — ABNORMAL LOW
LDL Cholesterol: 58 mg/dL (ref 10–99)
NonHDL: 73.02
Total CHOL/HDL Ratio: 3
Triglycerides: 73 mg/dL (ref 10.0–149.0)
VLDL: 14.6 mg/dL (ref 0.0–40.0)

## 2024-10-18 LAB — COMPREHENSIVE METABOLIC PANEL WITH GFR
ALT: 34 U/L (ref 3–53)
AST: 19 U/L (ref 5–37)
Albumin: 4.4 g/dL (ref 3.5–5.2)
Alkaline Phosphatase: 74 U/L (ref 39–117)
BUN: 10 mg/dL (ref 6–23)
CO2: 30 meq/L (ref 19–32)
Calcium: 9.1 mg/dL (ref 8.4–10.5)
Chloride: 102 meq/L (ref 96–112)
Creatinine, Ser: 0.86 mg/dL (ref 0.40–1.50)
GFR: 119.35 mL/min
Glucose, Bld: 92 mg/dL (ref 70–99)
Potassium: 4.4 meq/L (ref 3.5–5.1)
Sodium: 139 meq/L (ref 135–145)
Total Bilirubin: 0.6 mg/dL (ref 0.2–1.2)
Total Protein: 6.9 g/dL (ref 6.0–8.3)

## 2024-10-18 LAB — CBC
HCT: 44 % (ref 39.0–52.0)
Hemoglobin: 15.2 g/dL (ref 13.0–17.0)
MCHC: 34.6 g/dL (ref 30.0–36.0)
MCV: 84.2 fl (ref 78.0–100.0)
Platelets: 392 10*3/uL (ref 150.0–400.0)
RBC: 5.23 Mil/uL (ref 4.22–5.81)
RDW: 14 % (ref 11.5–15.5)
WBC: 6.6 10*3/uL (ref 4.0–10.5)

## 2024-10-18 NOTE — Patient Instructions (Signed)
 Stop by the lab prior to leaving today. I will notify you of your results once received.   It was a pleasure to see you today!

## 2024-10-18 NOTE — Assessment & Plan Note (Signed)
 Controlled.  Continue pantoprazole 40 mg daily.
